# Patient Record
Sex: Male | Born: 1935 | Hispanic: No | Marital: Married | Smoking: Former smoker
Health system: Southern US, Community
[De-identification: ages and names within clinical notes are randomized; demographics above are authoritative.]

## PROBLEM LIST (undated history)

## (undated) DIAGNOSIS — E119 Type 2 diabetes mellitus without complications: Secondary | ICD-10-CM

## (undated) DIAGNOSIS — I10 Essential (primary) hypertension: Secondary | ICD-10-CM

## (undated) DIAGNOSIS — J939 Pneumothorax, unspecified: Secondary | ICD-10-CM

## (undated) DIAGNOSIS — I509 Heart failure, unspecified: Secondary | ICD-10-CM

## (undated) DIAGNOSIS — I219 Acute myocardial infarction, unspecified: Secondary | ICD-10-CM

## (undated) DIAGNOSIS — C801 Malignant (primary) neoplasm, unspecified: Secondary | ICD-10-CM

## (undated) HISTORY — PX: CORONARY ARTERY BYPASS GRAFT: SHX141

## (undated) HISTORY — PX: CORONARY STENT PLACEMENT: SHX1402

---

## 1992-09-01 DIAGNOSIS — I2510 Atherosclerotic heart disease of native coronary artery without angina pectoris: Secondary | ICD-10-CM

## 1992-09-01 HISTORY — DX: Atherosclerotic heart disease of native coronary artery without angina pectoris: I25.10

## 2017-01-01 DIAGNOSIS — I340 Nonrheumatic mitral (valve) insufficiency: Secondary | ICD-10-CM | POA: Insufficient documentation

## 2017-01-01 DIAGNOSIS — I480 Paroxysmal atrial fibrillation: Secondary | ICD-10-CM | POA: Diagnosis present

## 2017-01-01 DIAGNOSIS — I351 Nonrheumatic aortic (valve) insufficiency: Secondary | ICD-10-CM | POA: Insufficient documentation

## 2017-01-01 DIAGNOSIS — E7800 Pure hypercholesterolemia, unspecified: Secondary | ICD-10-CM | POA: Insufficient documentation

## 2017-01-01 DIAGNOSIS — K56609 Unspecified intestinal obstruction, unspecified as to partial versus complete obstruction: Secondary | ICD-10-CM | POA: Insufficient documentation

## 2017-01-01 DIAGNOSIS — J449 Chronic obstructive pulmonary disease, unspecified: Secondary | ICD-10-CM | POA: Diagnosis present

## 2017-01-01 DIAGNOSIS — E271 Primary adrenocortical insufficiency: Secondary | ICD-10-CM | POA: Insufficient documentation

## 2017-01-01 DIAGNOSIS — I10 Essential (primary) hypertension: Secondary | ICD-10-CM | POA: Insufficient documentation

## 2017-01-05 DIAGNOSIS — Z951 Presence of aortocoronary bypass graft: Secondary | ICD-10-CM

## 2018-07-26 DIAGNOSIS — J95811 Postprocedural pneumothorax: Secondary | ICD-10-CM | POA: Insufficient documentation

## 2020-02-03 DIAGNOSIS — N179 Acute kidney failure, unspecified: Secondary | ICD-10-CM | POA: Insufficient documentation

## 2020-02-03 DIAGNOSIS — E871 Hypo-osmolality and hyponatremia: Secondary | ICD-10-CM | POA: Insufficient documentation

## 2020-03-21 ENCOUNTER — Encounter (HOSPITAL_BASED_OUTPATIENT_CLINIC_OR_DEPARTMENT_OTHER): Payer: Self-pay

## 2020-03-21 ENCOUNTER — Emergency Department (HOSPITAL_BASED_OUTPATIENT_CLINIC_OR_DEPARTMENT_OTHER): Payer: Medicare Other

## 2020-03-21 ENCOUNTER — Other Ambulatory Visit: Payer: Self-pay

## 2020-03-21 ENCOUNTER — Inpatient Hospital Stay (HOSPITAL_BASED_OUTPATIENT_CLINIC_OR_DEPARTMENT_OTHER)
Admission: EM | Admit: 2020-03-21 | Discharge: 2020-03-23 | DRG: 291 | Disposition: A | Discharge status: Home / Self Care | Payer: Medicare Other | Attending: Internal Medicine | Admitting: Internal Medicine

## 2020-03-21 DIAGNOSIS — J9621 Acute and chronic respiratory failure with hypoxia: Secondary | ICD-10-CM | POA: Diagnosis present

## 2020-03-21 DIAGNOSIS — I110 Hypertensive heart disease with heart failure: Secondary | ICD-10-CM | POA: Diagnosis present

## 2020-03-21 DIAGNOSIS — Z923 Personal history of irradiation: Secondary | ICD-10-CM

## 2020-03-21 DIAGNOSIS — Z79899 Other long term (current) drug therapy: Secondary | ICD-10-CM | POA: Diagnosis not present

## 2020-03-21 DIAGNOSIS — R7989 Other specified abnormal findings of blood chemistry: Secondary | ICD-10-CM

## 2020-03-21 DIAGNOSIS — I252 Old myocardial infarction: Secondary | ICD-10-CM

## 2020-03-21 DIAGNOSIS — Z955 Presence of coronary angioplasty implant and graft: Secondary | ICD-10-CM

## 2020-03-21 DIAGNOSIS — J9601 Acute respiratory failure with hypoxia: Secondary | ICD-10-CM | POA: Diagnosis present

## 2020-03-21 DIAGNOSIS — Z87891 Personal history of nicotine dependence: Secondary | ICD-10-CM | POA: Diagnosis not present

## 2020-03-21 DIAGNOSIS — J449 Chronic obstructive pulmonary disease, unspecified: Secondary | ICD-10-CM | POA: Diagnosis present

## 2020-03-21 DIAGNOSIS — Z7952 Long term (current) use of systemic steroids: Secondary | ICD-10-CM

## 2020-03-21 DIAGNOSIS — E785 Hyperlipidemia, unspecified: Secondary | ICD-10-CM | POA: Diagnosis present

## 2020-03-21 DIAGNOSIS — Z20822 Contact with and (suspected) exposure to covid-19: Secondary | ICD-10-CM | POA: Diagnosis present

## 2020-03-21 DIAGNOSIS — I480 Paroxysmal atrial fibrillation: Secondary | ICD-10-CM | POA: Diagnosis present

## 2020-03-21 DIAGNOSIS — G2581 Restless legs syndrome: Secondary | ICD-10-CM | POA: Diagnosis present

## 2020-03-21 DIAGNOSIS — Z951 Presence of aortocoronary bypass graft: Secondary | ICD-10-CM | POA: Diagnosis not present

## 2020-03-21 DIAGNOSIS — Z85118 Personal history of other malignant neoplasm of bronchus and lung: Secondary | ICD-10-CM | POA: Diagnosis not present

## 2020-03-21 DIAGNOSIS — D649 Anemia, unspecified: Secondary | ICD-10-CM | POA: Diagnosis present

## 2020-03-21 DIAGNOSIS — Z7901 Long term (current) use of anticoagulants: Secondary | ICD-10-CM | POA: Diagnosis not present

## 2020-03-21 DIAGNOSIS — I509 Heart failure, unspecified: Secondary | ICD-10-CM | POA: Diagnosis not present

## 2020-03-21 DIAGNOSIS — Z7902 Long term (current) use of antithrombotics/antiplatelets: Secondary | ICD-10-CM | POA: Diagnosis not present

## 2020-03-21 DIAGNOSIS — I2510 Atherosclerotic heart disease of native coronary artery without angina pectoris: Secondary | ICD-10-CM | POA: Diagnosis present

## 2020-03-21 DIAGNOSIS — I5023 Acute on chronic systolic (congestive) heart failure: Secondary | ICD-10-CM | POA: Diagnosis present

## 2020-03-21 DIAGNOSIS — Z9114 Patient's other noncompliance with medication regimen: Secondary | ICD-10-CM | POA: Diagnosis not present

## 2020-03-21 DIAGNOSIS — E119 Type 2 diabetes mellitus without complications: Secondary | ICD-10-CM | POA: Diagnosis present

## 2020-03-21 DIAGNOSIS — E271 Primary adrenocortical insufficiency: Secondary | ICD-10-CM | POA: Diagnosis present

## 2020-03-21 HISTORY — DX: Type 2 diabetes mellitus without complications: E11.9

## 2020-03-21 HISTORY — DX: Heart failure, unspecified: I50.9

## 2020-03-21 HISTORY — DX: Essential (primary) hypertension: I10

## 2020-03-21 HISTORY — DX: Pneumothorax, unspecified: J93.9

## 2020-03-21 HISTORY — DX: Acute myocardial infarction, unspecified: I21.9

## 2020-03-21 HISTORY — DX: Malignant (primary) neoplasm, unspecified: C80.1

## 2020-03-21 LAB — COMPREHENSIVE METABOLIC PANEL
ALT: 10 U/L (ref 0–44)
AST: 15 U/L (ref 15–41)
Albumin: 3.7 g/dL (ref 3.5–5.0)
Alkaline Phosphatase: 59 U/L (ref 38–126)
Anion gap: 9 (ref 5–15)
BUN: 14 mg/dL (ref 8–23)
CO2: 23 mmol/L (ref 22–32)
Calcium: 9.5 mg/dL (ref 8.9–10.3)
Chloride: 104 mmol/L (ref 98–111)
Creatinine, Ser: 0.82 mg/dL (ref 0.61–1.24)
GFR calc Af Amer: >60 mL/min (ref >60)
GFR calc non Af Amer: >60 mL/min (ref >60)
Glucose, Bld: 148 mg/dL — ABNORMAL HIGH (ref 70–99)
Potassium: 4.5 mmol/L (ref 3.5–5.1)
Sodium: 136 mmol/L (ref 135–145)
Total Bilirubin: 0.5 mg/dL (ref 0.3–1.2)
Total Protein: 7.1 g/dL (ref 6.5–8.1)

## 2020-03-21 LAB — LACTIC ACID, PLASMA
Lactic Acid, Venous: 3.3 mmol/L (ref 0.5–1.9)
Lactic Acid, Venous: 1.3 mmol/L (ref 0.5–1.9)

## 2020-03-21 LAB — URINALYSIS, ROUTINE W REFLEX MICROSCOPIC
Bilirubin Urine: NEGATIVE
Glucose, UA: NEGATIVE mg/dL
Hgb urine dipstick: NEGATIVE
Ketones, ur: NEGATIVE mg/dL
Leukocytes,Ua: NEGATIVE
Nitrite: NEGATIVE
Protein, ur: NEGATIVE mg/dL
Specific Gravity, Urine: 1.01 (ref 1.005–1.030)
pH: 5.5 (ref 5.0–8.0)

## 2020-03-21 LAB — CBC WITH DIFFERENTIAL/PLATELET
Abs Immature Granulocytes: 0.06 10*3/uL (ref 0.00–0.07)
Basophils Absolute: 0.1 10*3/uL (ref 0.0–0.1)
Basophils Relative: 0 %
Eosinophils Absolute: 0.2 10*3/uL (ref 0.0–0.5)
Eosinophils Relative: 1 %
HCT: 31 % — ABNORMAL LOW (ref 39.0–52.0)
Hemoglobin: 9.6 g/dL — ABNORMAL LOW (ref 13.0–17.0)
Immature Granulocytes: 1 %
Lymphocytes Relative: 26 %
Lymphs Abs: 3.1 10*3/uL (ref 0.7–4.0)
MCH: 23.6 pg — ABNORMAL LOW (ref 26.0–34.0)
MCHC: 31 g/dL (ref 30.0–36.0)
MCV: 76.4 fL — ABNORMAL LOW (ref 80.0–100.0)
Monocytes Absolute: 1.2 10*3/uL — ABNORMAL HIGH (ref 0.1–1.0)
Monocytes Relative: 10 %
Neutro Abs: 7.5 10*3/uL (ref 1.7–7.7)
Neutrophils Relative %: 62 %
Platelets: 349 10*3/uL (ref 150–400)
RBC: 4.06 MIL/uL — ABNORMAL LOW (ref 4.22–5.81)
RDW: 18.7 % — ABNORMAL HIGH (ref 11.5–15.5)
WBC: 12.1 10*3/uL — ABNORMAL HIGH (ref 4.0–10.5)
nRBC: 0 % (ref 0.0–0.2)

## 2020-03-21 LAB — TROPONIN I (HIGH SENSITIVITY)
Troponin I (High Sensitivity): 28 ng/L — ABNORMAL HIGH (ref <18)
Troponin I (High Sensitivity): 29 ng/L — ABNORMAL HIGH (ref <18)

## 2020-03-21 LAB — BRAIN NATRIURETIC PEPTIDE: B Natriuretic Peptide: 1231.1 pg/mL — ABNORMAL HIGH (ref 0.0–100.0)

## 2020-03-21 LAB — OCCULT BLOOD X 1 CARD TO LAB, STOOL: Fecal Occult Bld: POSITIVE — AB

## 2020-03-21 LAB — SARS CORONAVIRUS 2 BY RT PCR (HOSPITAL ORDER, PERFORMED IN ~~LOC~~ HOSPITAL LAB): SARS Coronavirus 2: NEGATIVE

## 2020-03-21 MED ORDER — ALBUTEROL SULFATE (2.5 MG/3ML) 0.083% IN NEBU
INHALATION_SOLUTION | RESPIRATORY_TRACT | Status: AC
  Administered 2020-03-21: 2.5 mg
  Filled 2020-03-21: qty 3

## 2020-03-21 MED ORDER — FUROSEMIDE 10 MG/ML IJ SOLN
60.0000 mg | Freq: Once | INTRAMUSCULAR | Status: AC
  Administered 2020-03-21: 60 mg via INTRAVENOUS
  Filled 2020-03-21: qty 6

## 2020-03-21 MED ORDER — LORAZEPAM 2 MG/ML IJ SOLN
INTRAMUSCULAR | Status: AC
  Filled 2020-03-21: qty 1

## 2020-03-21 MED ORDER — LORAZEPAM 2 MG/ML IJ SOLN
0.5000 mg | Freq: Once | INTRAMUSCULAR | Status: AC
  Administered 2020-03-21: 0.5 mg via INTRAVENOUS

## 2020-03-21 MED ORDER — PANTOPRAZOLE SODIUM 40 MG IV SOLR
40.0000 mg | Freq: Once | INTRAVENOUS | Status: AC
  Administered 2020-03-21: 40 mg via INTRAVENOUS
  Filled 2020-03-21: qty 40

## 2020-03-21 MED ORDER — ALBUTEROL SULFATE (2.5 MG/3ML) 0.083% IN NEBU
2.5000 mg | INHALATION_SOLUTION | Freq: Once | RESPIRATORY_TRACT | Status: AC
  Administered 2020-03-21: 2.5 mg via RESPIRATORY_TRACT
  Filled 2020-03-21: qty 3

## 2020-03-21 MED ORDER — ACETAMINOPHEN 500 MG PO TABS
1000.0000 mg | ORAL_TABLET | Freq: Once | ORAL | Status: AC
  Administered 2020-03-21: 1000 mg via ORAL
  Filled 2020-03-21: qty 2

## 2020-03-21 MED ORDER — SODIUM CHLORIDE 0.9 % IV SOLN
500.0000 mg | INTRAVENOUS | Status: DC
  Administered 2020-03-21: 500 mg via INTRAVENOUS
  Filled 2020-03-21 (×2): qty 500

## 2020-03-21 MED ORDER — SODIUM CHLORIDE 0.9 % IV SOLN: INTRAVENOUS | Status: DC | PRN

## 2020-03-21 MED ORDER — SODIUM CHLORIDE 0.9 % IV SOLN
2.0000 g | INTRAVENOUS | Status: DC
  Administered 2020-03-21: 2 g via INTRAVENOUS
  Filled 2020-03-21 (×2): qty 20

## 2020-03-21 NOTE — ED Provider Notes (Addendum)
[facility] EMERGENCY DEPARTMENT Provider Note   CSN: [id number] Arrival date & time: 03/21/20  2878     History Chief Complaint  Patient presents with  . Shortness of Breath   Granddaughter translating, they declined formal interpreted.   Cody Charles is a 86 y.o. male with history of CAD s/p CABG, AMI May 2021, lung cancer s/p radiation now on surveillance, COPD on at home oxygen 2-3 L [place] PRN and at night, grade 1 diastolic dysfunction EF 67-67%, PAF on Eliquis, HTN, HLD presents to ER for evaluation of several concerns including worsening shortness of breath associated with chest and abdominal "burning", black diarrhea and dry mouth.  Reports long history of chronic exertional shortness of breath and orthopnea associated with chest "burning" that gradually for years, seemed to worsen after lung cancer diagnosis and recent heart attack may 2021, acutely worsened in last 3-4 days.  Feels like he can't take deep breaths. Taking fast short breaths.  Shortness of breath worse with activity, climbing up stairs.  Better if he sits forward, rests.  No relief with albuterol inhalers.  States usually he uses home oxygen 2-3 hours a day and at night only.  Has had to use 3 L [place] oxygen at home 24/7 last 3-4 days.  Shortness of breath accompanied by burning in his chest that admits has been going on for years as well.  No chest discomfort or burning currently. Chronic fatigue. Chronic productive blood tinged cough for years that actually seems a little bit better with less sputum and less blood.  Pink tinged. No fever.  Noticed black watery diarrhea 2 days ago, having 2-3 episodes a day.  Some burning in stomach around umbilicus and lower left side.  No nausea, vomiting.  No congestion, sore throat, Uri symptoms. Vaccinated for [place].  Compliant with all meds including Eliquis and fluid pills. No changes in urine output.  No longer smoking.  He drove down to visit granddaughter from [place] 3-4 days ago.    HPI     Past Medical History:  Diagnosis Date  . AMI (acute myocardial infarction) ([place])   . Cancer ([place])    lung   . CHF (congestive heart failure) ([place])   . Coronary artery disease   . Diabetes mellitus without complication ([place])   . Pneumothorax on right     Patient Active Problem List   Diagnosis Date Noted  . Acute on chronic respiratory failure with hypoxia ([place]) 03/21/2020  . AKI (acute kidney injury) ([place]) 02/03/2020  . Hypo-osmolality and hyponatremia 02/03/2020  . Pneumothorax, post biopsy, right 07/26/2018  . S/P CABG (coronary artery bypass graft) 01/05/2017  . Addison disease ([place]) 01/01/2017  . COPD (chronic obstructive pulmonary disease) ([place]) 01/01/2017  . Hypercholesterolemia 01/01/2017  . Hypertension 01/01/2017  . Mild AI (aortic insufficiency) 01/01/2017  . Mild mitral regurgitation 01/01/2017  . PAF (paroxysmal atrial fibrillation) ([place]) 01/01/2017  . SBO (small bowel obstruction) ([place]) 01/01/2017    Past Surgical History:  Procedure Laterality Date  . CORONARY ARTERY BYPASS GRAFT    . CORONARY STENT PLACEMENT         No family history on file.  Social History   Tobacco Use  . Smoking status: Former Research scientist (life sciences)  . Smokeless tobacco: Never Used  Vaping Use  . Vaping Use: Never used  Substance Use Topics  . Alcohol use: Not Currently  . Drug use: Never    Home Medications Prior to Admission medications   Medication Sig Start Date End  Date Taking? Authorizing Provider  diltiazem (CARDIZEM) 100 MG injection Inject 120 mg into the vein daily.   Yes [provider]  enalapril (VASOTEC) 10 MG tablet Take 10 mg by mouth daily.   Yes [provider]  albuterol (ACCUNEB) 0.63 MG/3ML nebulizer solution Inhale into the lungs.    [provider]  apixaban (ELIQUIS) 5 MG TABS tablet Take by mouth.    [provider]  atorvastatin (LIPITOR) 40 MG tablet Take by mouth.    [provider]  carvedilol (COREG)  6.25 MG tablet Take by mouth.    [provider]  Cholecalciferol 25 MCG (1000 UT) tablet Take by mouth.    [provider]  clopidogrel (PLAVIX) 75 MG tablet Take by mouth.    [provider]  Fluticasone-Umeclidin-Vilant (TRELEGY ELLIPTA) 100-62.5-25 MCG/INH AEPB Inhale into the lungs.    [provider]  pantoprazole (PROTONIX) 40 MG tablet Take by mouth.    [provider]  pravastatin (PRAVACHOL) 10 MG tablet Take by mouth.    [provider]  predniSONE (DELTASONE) 5 MG tablet Take by mouth.    [provider]  roflumilast (DALIRESP) 500 MCG TABS tablet Take by mouth.    [provider]  rOPINIRole (REQUIP) 2 MG tablet Take by mouth.    [provider]    Allergies    Patient has no known allergies.  Review of Systems   Review of Systems  Constitutional: Positive for fatigue.  Respiratory: Positive for cough and shortness of breath.   Cardiovascular: Positive for chest pain.  Gastrointestinal: Positive for abdominal pain (burning), constipation and diarrhea.  Hematological: Bruises/bleeds easily.  All other systems reviewed and are negative.   Physical Exam Updated Vital Signs BP 113/84   Pulse 91   Temp 98.4 F (36.9 C) (Oral)   Resp (!) 24   Wt 70.2 kg   SpO2 100%   Physical Exam Vitals and nursing note reviewed.  Constitutional:      General: He is not in acute distress.    Appearance: He is well-developed.     Comments: NAD.  HENT:     Head: Normocephalic and atraumatic.     Right Ear: External ear normal.     Left Ear: External ear normal.     Nose: Nose normal.     Mouth/Throat:     Comments: Dry lips and MM Eyes:     General: No scleral icterus.    Conjunctiva/sclera: Conjunctivae normal.  Cardiovascular:     Rate and Rhythm: Normal rate and regular rhythm.     Heart sounds: Normal heart sounds. No murmur heard.      Comments: No LE edema. No calf tenderness. Pulmonary:      Effort: Pulmonary effort is normal. Tachypnea present.     Breath sounds: Examination of the right-upper field reveals decreased breath sounds. Examination of the right-middle field reveals decreased breath sounds. Decreased breath sounds present. No wheezing.     Comments: Diminished air sounds in right lung. Tachypnic, noted to take frequent short breaths. Improves with 3 L [place].  No crackles or rhonchi.  Abdominal:     Palpations: Abdomen is soft.     Tenderness: There is no abdominal tenderness.     Comments: Ventral hernia, soft non tender, easily reducible.  No abdominal tenderness. No obvious distention. No CVA or suprapubic tenderness.  Musculoskeletal:        General: No deformity. Normal range of motion.  Cervical back: Normal range of motion and neck supple.  Skin:    General: Skin is warm and dry.     Capillary Refill: Capillary refill takes less than 2 seconds.  Neurological:     Mental Status: He is alert and oriented to person, place, and time.  Psychiatric:        Behavior: Behavior normal.        Thought Content: Thought content normal.        Judgment: Judgment normal.     ED Results / Procedures / Treatments   Labs (all labs ordered are listed, but only abnormal results are displayed) Labs Reviewed  COMPREHENSIVE METABOLIC PANEL - Abnormal; Notable for the following components:      Result Value   Glucose, Bld 148 (*)    All other components within normal limits  CBC WITH DIFFERENTIAL/PLATELET - Abnormal; Notable for the following components:   WBC 12.1 (*)    RBC 4.06 (*)    Hemoglobin 9.6 (*)    HCT 31.0 (*)    MCV 76.4 (*)    MCH 23.6 (*)    RDW 18.7 (*)    Monocytes Absolute 1.2 (*)    All other components within normal limits  BRAIN NATRIURETIC PEPTIDE - Abnormal; Notable for the following components:   B Natriuretic Peptide 1,231.1 (*)    All other components within normal limits  LACTIC ACID, PLASMA - Abnormal; Notable for the following  components:   Lactic Acid, Venous 3.3 (*)    All other components within normal limits  OCCULT BLOOD X 1 CARD TO LAB, STOOL - Abnormal; Notable for the following components:   Fecal Occult Bld POSITIVE (*)    All other components within normal limits  TROPONIN I (HIGH SENSITIVITY) - Abnormal; Notable for the following components:   Troponin I (High Sensitivity) 28 (*)    All other components within normal limits  TROPONIN I (HIGH SENSITIVITY) - Abnormal; Notable for the following components:   Troponin I (High Sensitivity) 29 (*)    All other components within normal limits  SARS CORONAVIRUS 2 BY RT PCR (HOSPITAL ORDER, [facility])  CULTURE, BLOOD (ROUTINE X 2)  CULTURE, BLOOD (ROUTINE X 2)  URINALYSIS, ROUTINE W REFLEX MICROSCOPIC  LACTIC ACID, PLASMA  POC OCCULT BLOOD, ED    EKG EKG Interpretation  Date/Time:  Wednesday March 21 2020 09:19:36 EDT Ventricular Rate:  96 PR Interval:    QRS Duration: 93 QT Interval:  344 QTC Calculation: 435 R Axis:   0 Text Interpretation: Sinus rhythm `st depression I, II, V2-V5 Baseline wander No previous tracing Confirmed by Lajean Saver [phone]) on 03/21/2020 9:26:48 AM   Radiology DG Chest 2 View  Result Date: 03/21/2020 CLINICAL DATA:  Shortness of breath EXAM: CHEST - 2 VIEW COMPARISON:  None. FINDINGS: Right mid lung consolidation. Interstitial prominence. Lucency in the upper lungs may reflect bulla. No pleural effusion. No pneumothorax. Normal heart size. No acute osseous abnormality. IMPRESSION: Right mid lung consolidation suspicious for pneumonia. Follow-up is recommended to ensure resolution. Age-indeterminate interstitial prominence which may be chronic, reflect edema, or changes associated with pneumonia. Electronically Signed   By: Macy Mis M.D.   On: 03/21/2020 10:41    Procedures .Critical Care Performed by: Kinnie Feil, PA-C Authorized by: Kinnie Feil, PA-C   Critical care  provider statement:    Critical care time (minutes):  45   Critical care was necessary to treat or prevent imminent  or life-threatening deterioration of the following conditions:  Cardiac failure   Critical care was time spent personally by me on the following activities:  Discussions with consultants, evaluation of patient's response to treatment, examination of patient, ordering and performing treatments and interventions, ordering and review of laboratory studies, ordering and review of radiographic studies, pulse oximetry, re-evaluation of patient's condition, obtaining history from patient or surrogate, review of old charts and development of treatment plan with patient or surrogate   I assumed direction of critical care for this patient from another provider in my specialty: no     (including critical care time)  Medications Ordered in ED Medications  cefTRIAXone (ROCEPHIN) 2 g in sodium chloride 0.9 % 100 mL IVPB ( Intravenous Stopped 03/21/20 1208)  azithromycin (ZITHROMAX) 500 mg in sodium chloride 0.9 % 250 mL IVPB ( Intravenous Stopped 03/21/20 1322)  0.9 %  sodium chloride infusion ( Intravenous New Bag/Given 03/21/20 1123)  albuterol (PROVENTIL) (2.5 MG/3ML) 0.083% nebulizer solution 2.5 mg (has no administration in time range)  furosemide (LASIX) injection 60 mg (60 mg Intravenous Given 03/21/20 1135)  pantoprazole (PROTONIX) injection 40 mg (40 mg Intravenous Given 03/21/20 1254)    ED Course  I have reviewed the triage vital signs and the nursing notes.  Pertinent labs & imaging results that were available during my care of the patient were reviewed by me and considered in my medical decision making (see chart for details).  Clinical Course as of Mar 21 1412  Wed Mar 21, 2020  1116 On chronic prednisone   WBC(!): 12.1 [CG]  1123 B Natriuretic Peptide(!): 1,231.1 [CG]  1123 Troponin I (High Sensitivity)(!): 28 [CG]  1123 Right mid lung consolidation suspicious for pneumonia.  Follow-up is recommended to ensure resolution. Age-indeterminate interstitial prominence which may be chronic, reflect edema, or changes associated with pneumonia.  DG Chest 2 View [CG]  1234 Fecal Occult Blood, POC(!): POSITIVE [CG]  1234 Lactic Acid, Venous(!!): 3.3 [CG]  1234 Troponin I (High Sensitivity)(!): 29 [CG]  1234 SARS Coronavirus 2: NEGATIVE [CG]  1235 Sinus rhythm `st depression I, II, V2-V5 Baseline wander No previous tracing Confirmed by Lajean Saver [phone]) on 03/21/2020 9:26:48 AM  EKG 12-Lead [CG]  1235 No active CP  EKG 12-Lead [CG]    Clinical Course User Index [CG] Kinnie Feil, PA-C   MDM Rules/Calculators/A&P                          I obtained additional history from triage, nursing notes and review of medical chart.  Previous medical records available, nursing notes reviewed to obtain more history and assist with MDM  I reviewed hospitalization in may 2021 [facility].  Admitted for STEMI with emergent CABG/LHC.  BNP 1120 and was diuresed discharged on diuretics.  Noted to have stable small right apical PTX, AKI.  Imaging revealed possible RUL infiltrates but afebrile, no leukocytosis and procalcitonin negative.  Mid lung density noted.  Strep pneumo and sputum culture negative, ID consulted recommended dc antibiotics and monitor.    This patient complains of acute on chronic SOB exertional with orthopnea, chest burning, diarrhea that is "black" like coffee.  On eliquis. Claims he has not missed any medicines, granddaughter unsure.    Chief complain involves an extensive number of treatment options and is a complaint that carries with it a high risk of complications and morbidity and mortality.    The differential diagnosis includes GIB/symptomatic anemia, CHF exacerbation, health  care associated PNA, ACS/NSTEMI.  ?PE, COVID unlikely.  COPD exacebration.  Likely multifactorial from CHF, COPD, lung cancer.    I ordered laboratory studies including  including screening labs, trop, BNP, COVID.   I ordered imaging studies including CXR, EKG.   1240: ER work up personally visualized and interpreted as above.   Stable hemoglobin. Sample for hemoccult from DRE exam but also from stool in commode that was mixed with urine. Stool was brown, formed. No abdominal tenderness.  Will send UA.   BNP 1231. Trop 29. EKG with STD I, II and V4-6.  No active CP.  ?Stress/demand ischemia.  Will repeat trops.    Lactic acid 3.3. But patient afebrile, without tachycardia.  Reports improvement in his chronic cough/sputum.  Mild leukocytosis but on chronic prednisone. CXR today shows possible PNA but this ?infiltrate has been noted in previous CXR at OSH.  Lower suspicion for infectious process.  EDP ordered antibiotics, blood cultures, repeat lactic acid.  Will hold off on IVF since no convincing symptoms of infectious, hypotension.  Lactic acid may be from dehydration, other. AG normal.   I ordered medications protonix, albuterol nebulizer, lasix.  Strict IO.   Spoke to Dr Lorin Mercy who has admitted patient to progressive bed.    Re-evaluated patient who is hesitant to be admitted but agreeable.  Will message GI on call for hemoglobin 9.6 with positive brown hemoccult for evaluation as admitted. Patient is HD stable without active or severe GI hemorrhage.   Final Clinical Impression(s) / ED Diagnoses Final diagnoses:  Elevated brain natriuretic peptide (BNP) level    Rx / DC Orders ED Discharge Orders    None         Arlean Hopping 03/21/20 1413    Lajean Saver, MD 03/22/20 [phone]

## 2020-03-21 NOTE — ED Notes (Signed)
Patients Granddaughter Contact Information  Ayesha Mohair (granddaughter) [phone]

## 2020-03-21 NOTE — ED Notes (Signed)
Engineer, technical sales at bedside, paperwork provided, bedside report reinforced

## 2020-03-21 NOTE — ED Notes (Signed)
BC X 2 OBTAINED PRIOR TO ABX ADMINISTERED AS PER EDP ORDERS

## 2020-03-21 NOTE — ED Notes (Signed)
Pt on monitor 

## 2020-03-21 NOTE — ED Notes (Signed)
Cont to await for room assignment, family at bedside, charge nurse has approved that outside food be brought in due to his cultural diet since awaiting admission to [facility].

## 2020-03-21 NOTE — ED Notes (Signed)
Attempted to contact granddaughter , Cody Charles, no answer at this time.

## 2020-03-21 NOTE — ED Notes (Signed)
Patient rang the bell and I went to the room to check patient.  Patient's daughter at bedside.  Patient stated that he has difficulty breathing.  Informed patient and daughter that his O2 sat is 100% on 02 [place].  Encouraged him to stay calm and relaxed.  He also wanted to know about his transfer to another hospital.  Updated them that at this time, we are waiting for a room assignment and once we have the Room I will make sure that they will be informed.  Both of them verbalized understanding.

## 2020-03-21 NOTE — ED Notes (Signed)
PO fluids and snacks provided

## 2020-03-21 NOTE — ED Triage Notes (Signed)
Pt presents with ShOB and a "burning sensation" across his chest. Orthopnea present. Denies peripheral edema. Pt has a hx of AMI at the end of May. Pt has home O2 2L prn, but O2 has not helped ShOB.

## 2020-03-21 NOTE — ED Notes (Signed)
RT note-Albuterol treatment given, HR 87, o2 remains at 2l/min [place], sp02 97%

## 2020-03-21 NOTE — ED Notes (Signed)
Medicated per EDP orders, safety measures in place, cont on cardiac monitor with cont POX monitoring and int NBP assessments. Pt instructed not to get up off stretcher w/o staff in room, side rails x 2 remain up and bed in lowest position, callbell next to pt.

## 2020-03-21 NOTE — Progress Notes (Addendum)
[facility] to [facility] transfer:  Patient with h/o DM; CAD s/p CABG; chronic diastolic CHF; COPD on 7-6E home O2; afib on Eliquis; HTN; HLD; and lung cancer s/p radiation and now on surveillance presenting to [facility] with CP/SOB.  Patient is from [place], drove to [place] to visit granddaughter 4 days ago.  Upon arrival, acute on chronic SOB, exertional.  +orthopnea and chest burning.  Symptoms are chronic but worse.  Uses O2 qhs and prn but has been using 24/7.  Appears to be CHF - BNP 1200, troponin 28.  EKG with T wave flattening in lateral leads and I, II.  No active CP, discomfort there is more chronic in nature.  CXR from May with ?PNA (also small PTX, needed stents then) - chronic, edema, PNA are all possible.  No fever. Lactate pending.  Maybe early PNA, but also COPD, lung CA.  Given antibiotics, Lasix.  Likely needs diuresis, serial troponin.  COVID vaccinated.  Also with black diarrhea, brown stool on guaiac but heme positive, Hgb stable.  Likely needs admission to progressive care unit, will place order.   Carlyon Shadow, M.D

## 2020-03-22 ENCOUNTER — Encounter (HOSPITAL_COMMUNITY): Payer: Self-pay | Admitting: Internal Medicine

## 2020-03-22 DIAGNOSIS — D649 Anemia, unspecified: Secondary | ICD-10-CM | POA: Diagnosis present

## 2020-03-22 DIAGNOSIS — J9621 Acute and chronic respiratory failure with hypoxia: Secondary | ICD-10-CM

## 2020-03-22 DIAGNOSIS — I509 Heart failure, unspecified: Secondary | ICD-10-CM

## 2020-03-22 DIAGNOSIS — J9601 Acute respiratory failure with hypoxia: Secondary | ICD-10-CM | POA: Diagnosis present

## 2020-03-22 DIAGNOSIS — J449 Chronic obstructive pulmonary disease, unspecified: Secondary | ICD-10-CM

## 2020-03-22 LAB — CBC
HCT: 34.1 % — ABNORMAL LOW (ref 39.0–52.0)
HCT: 33.4 % — ABNORMAL LOW (ref 39.0–52.0)
HCT: 34.9 % — ABNORMAL LOW (ref 39.0–52.0)
Hemoglobin: 10.8 g/dL — ABNORMAL LOW (ref 13.0–17.0)
Hemoglobin: 10.6 g/dL — ABNORMAL LOW (ref 13.0–17.0)
Hemoglobin: 11 g/dL — ABNORMAL LOW (ref 13.0–17.0)
MCH: 23.6 pg — ABNORMAL LOW (ref 26.0–34.0)
MCH: 24 pg — ABNORMAL LOW (ref 26.0–34.0)
MCH: 23.8 pg — ABNORMAL LOW (ref 26.0–34.0)
MCHC: 31.7 g/dL (ref 30.0–36.0)
MCHC: 31.7 g/dL (ref 30.0–36.0)
MCHC: 31.5 g/dL (ref 30.0–36.0)
MCV: 74.6 fL — ABNORMAL LOW (ref 80.0–100.0)
MCV: 75.6 fL — ABNORMAL LOW (ref 80.0–100.0)
MCV: 75.4 fL — ABNORMAL LOW (ref 80.0–100.0)
Platelets: 403 10*3/uL — ABNORMAL HIGH (ref 150–400)
Platelets: 383 10*3/uL (ref 150–400)
Platelets: 393 10*3/uL (ref 150–400)
RBC: 4.57 MIL/uL (ref 4.22–5.81)
RBC: 4.42 MIL/uL (ref 4.22–5.81)
RBC: 4.63 MIL/uL (ref 4.22–5.81)
RDW: 19.1 % — ABNORMAL HIGH (ref 11.5–15.5)
RDW: 18.8 % — ABNORMAL HIGH (ref 11.5–15.5)
RDW: 18.8 % — ABNORMAL HIGH (ref 11.5–15.5)
WBC: 10.1 10*3/uL (ref 4.0–10.5)
WBC: 10.2 10*3/uL (ref 4.0–10.5)
WBC: 11 10*3/uL — ABNORMAL HIGH (ref 4.0–10.5)
nRBC: 0 % (ref 0.0–0.2)
nRBC: 0 % (ref 0.0–0.2)
nRBC: 0 % (ref 0.0–0.2)

## 2020-03-22 LAB — BASIC METABOLIC PANEL
Anion gap: 14 (ref 5–15)
BUN: 25 mg/dL — ABNORMAL HIGH (ref 8–23)
CO2: 18 mmol/L — ABNORMAL LOW (ref 22–32)
Calcium: 8.2 mg/dL — ABNORMAL LOW (ref 8.9–10.3)
Chloride: 101 mmol/L (ref 98–111)
Creatinine, Ser: 1.24 mg/dL (ref 0.61–1.24)
GFR calc Af Amer: >60 mL/min (ref >60)
GFR calc non Af Amer: 53 mL/min — ABNORMAL LOW (ref >60)
Glucose, Bld: 141 mg/dL — ABNORMAL HIGH (ref 70–99)
Potassium: 4.3 mmol/L (ref 3.5–5.1)
Sodium: 133 mmol/L — ABNORMAL LOW (ref 135–145)

## 2020-03-22 LAB — TYPE AND SCREEN
ABO/RH(D): B POS
Antibody Screen: NEGATIVE

## 2020-03-22 LAB — APTT
aPTT: 78 seconds — ABNORMAL HIGH (ref 24–36)
aPTT: 99 seconds — ABNORMAL HIGH (ref 24–36)

## 2020-03-22 LAB — MAGNESIUM: Magnesium: 2 mg/dL (ref 1.7–2.4)

## 2020-03-22 LAB — TSH: TSH: 0.604 u[IU]/mL (ref 0.350–4.500)

## 2020-03-22 LAB — HEPARIN LEVEL (UNFRACTIONATED): Heparin Unfractionated: 0.87 IU/mL — ABNORMAL HIGH (ref 0.30–0.70)

## 2020-03-22 LAB — ABO/RH: ABO/RH(D): B POS

## 2020-03-22 LAB — TROPONIN I (HIGH SENSITIVITY)
Troponin I (High Sensitivity): 39 ng/L — ABNORMAL HIGH (ref <18)
Troponin I (High Sensitivity): 35 ng/L — ABNORMAL HIGH (ref <18)

## 2020-03-22 LAB — PROCALCITONIN: Procalcitonin: <0.1 ng/mL

## 2020-03-22 MED ORDER — BUDESONIDE 0.25 MG/2ML IN SUSP
0.2500 mg | Freq: Two times a day (BID) | RESPIRATORY_TRACT | Status: DC
  Administered 2020-03-22 – 2020-03-23 (×2): 0.25 mg via RESPIRATORY_TRACT
  Filled 2020-03-22 (×3): qty 2

## 2020-03-22 MED ORDER — ALBUTEROL SULFATE (2.5 MG/3ML) 0.083% IN NEBU: 2.5000 mg | INHALATION_SOLUTION | RESPIRATORY_TRACT | Status: DC | PRN

## 2020-03-22 MED ORDER — PANTOPRAZOLE SODIUM 40 MG PO TBEC
40.0000 mg | DELAYED_RELEASE_TABLET | Freq: Every day | ORAL | Status: DC
  Administered 2020-03-22 – 2020-03-23 (×2): 40 mg via ORAL
  Filled 2020-03-22: qty 2
  Filled 2020-03-22: qty 1

## 2020-03-22 MED ORDER — ONDANSETRON HCL 4 MG PO TABS: 4.0000 mg | ORAL_TABLET | Freq: Four times a day (QID) | ORAL | Status: DC | PRN

## 2020-03-22 MED ORDER — FUROSEMIDE 10 MG/ML IJ SOLN
40.0000 mg | Freq: Two times a day (BID) | INTRAMUSCULAR | Status: DC
  Administered 2020-03-22 – 2020-03-23 (×4): 40 mg via INTRAVENOUS
  Filled 2020-03-22 (×4): qty 4

## 2020-03-22 MED ORDER — PREDNISONE 5 MG PO TABS
5.0000 mg | ORAL_TABLET | Freq: Every day | ORAL | Status: DC
  Administered 2020-03-22 – 2020-03-23 (×2): 5 mg via ORAL
  Filled 2020-03-22 (×3): qty 1

## 2020-03-22 MED ORDER — ACETAMINOPHEN 325 MG PO TABS
650.0000 mg | ORAL_TABLET | Freq: Four times a day (QID) | ORAL | Status: DC | PRN
  Administered 2020-03-22: 650 mg via ORAL
  Filled 2020-03-22: qty 2

## 2020-03-22 MED ORDER — LEVALBUTEROL HCL 0.63 MG/3ML IN NEBU: 0.6300 mg | INHALATION_SOLUTION | Freq: Four times a day (QID) | RESPIRATORY_TRACT | Status: DC | PRN

## 2020-03-22 MED ORDER — LORAZEPAM 2 MG/ML IJ SOLN
0.5000 mg | Freq: Once | INTRAMUSCULAR | Status: AC
  Administered 2020-03-22: 0.5 mg via INTRAVENOUS
  Filled 2020-03-22: qty 1

## 2020-03-22 MED ORDER — IPRATROPIUM BROMIDE 0.02 % IN SOLN: 0.5000 mg | Freq: Four times a day (QID) | RESPIRATORY_TRACT | Status: DC

## 2020-03-22 MED ORDER — ENALAPRIL MALEATE 5 MG PO TABS
10.0000 mg | ORAL_TABLET | Freq: Every day | ORAL | Status: DC
  Administered 2020-03-22: 10 mg via ORAL
  Filled 2020-03-22: qty 2

## 2020-03-22 MED ORDER — CLOPIDOGREL BISULFATE 75 MG PO TABS
75.0000 mg | ORAL_TABLET | Freq: Every day | ORAL | Status: DC
  Administered 2020-03-22 – 2020-03-23 (×3): 75 mg via ORAL
  Filled 2020-03-22 (×3): qty 1

## 2020-03-22 MED ORDER — CARVEDILOL 6.25 MG PO TABS
6.2500 mg | ORAL_TABLET | Freq: Two times a day (BID) | ORAL | Status: DC
  Administered 2020-03-22: 6.25 mg via ORAL
  Filled 2020-03-22: qty 1

## 2020-03-22 MED ORDER — IPRATROPIUM BROMIDE 0.02 % IN SOLN
0.5000 mg | Freq: Two times a day (BID) | RESPIRATORY_TRACT | Status: DC
  Administered 2020-03-22 – 2020-03-23 (×2): 0.5 mg via RESPIRATORY_TRACT
  Filled 2020-03-22 (×3): qty 2.5

## 2020-03-22 MED ORDER — HEPARIN (PORCINE) 25000 UT/250ML-% IV SOLN
1000.0000 [IU]/h | INTRAVENOUS | Status: DC
  Administered 2020-03-22 (×2): 1000 [IU]/h via INTRAVENOUS
  Filled 2020-03-22 (×2): qty 250

## 2020-03-22 MED ORDER — PRAVASTATIN SODIUM 10 MG PO TABS: 10.0000 mg | ORAL_TABLET | Freq: Every day | ORAL | Status: DC

## 2020-03-22 MED ORDER — ALBUTEROL SULFATE (2.5 MG/3ML) 0.083% IN NEBU: 2.5000 mg | INHALATION_SOLUTION | Freq: Two times a day (BID) | RESPIRATORY_TRACT | Status: DC

## 2020-03-22 MED ORDER — DILTIAZEM HCL 30 MG PO TABS
30.0000 mg | ORAL_TABLET | Freq: Four times a day (QID) | ORAL | Status: DC
  Administered 2020-03-22: 30 mg via ORAL
  Filled 2020-03-22 (×2): qty 1

## 2020-03-22 MED ORDER — ROFLUMILAST 500 MCG PO TABS
500.0000 ug | ORAL_TABLET | Freq: Every day | ORAL | Status: DC
  Administered 2020-03-22 – 2020-03-23 (×2): 500 ug via ORAL
  Filled 2020-03-22 (×2): qty 1

## 2020-03-22 MED ORDER — METOPROLOL TARTRATE 12.5 MG HALF TABLET
12.5000 mg | ORAL_TABLET | Freq: Two times a day (BID) | ORAL | Status: DC
  Administered 2020-03-22 – 2020-03-23 (×2): 12.5 mg via ORAL
  Filled 2020-03-22 (×2): qty 1

## 2020-03-22 MED ORDER — ONDANSETRON HCL 4 MG/2ML IJ SOLN: 4.0000 mg | Freq: Four times a day (QID) | INTRAMUSCULAR | Status: DC | PRN

## 2020-03-22 MED ORDER — ROPINIROLE HCL 1 MG PO TABS
2.0000 mg | ORAL_TABLET | Freq: Every day | ORAL | Status: DC
  Administered 2020-03-22 (×2): 2 mg via ORAL
  Filled 2020-03-22 (×2): qty 2

## 2020-03-22 MED ORDER — METOPROLOL TARTRATE 5 MG/5ML IV SOLN
5.0000 mg | Freq: Once | INTRAVENOUS | Status: AC
  Administered 2020-03-22: 5 mg via INTRAVENOUS
  Filled 2020-03-22: qty 5

## 2020-03-22 MED ORDER — ALBUTEROL SULFATE (2.5 MG/3ML) 0.083% IN NEBU: 2.5000 mg | INHALATION_SOLUTION | Freq: Four times a day (QID) | RESPIRATORY_TRACT | Status: DC

## 2020-03-22 MED ORDER — ATORVASTATIN CALCIUM 40 MG PO TABS
40.0000 mg | ORAL_TABLET | Freq: Every day | ORAL | Status: DC
  Administered 2020-03-22: 40 mg via ORAL
  Filled 2020-03-22: qty 1

## 2020-03-22 NOTE — Progress Notes (Signed)
ANTICOAGULATION CONSULT NOTE - Follow-Up Consult  Pharmacy Consult for heparin Indication: atrial fibrillation  No Known Allergies  Patient Measurements: Weight: 70.2 kg (154 lb 12.8 oz)  Vital Signs: Temp: 98.1 F (36.7 C) (07/22 0348) Temp Source: Oral (07/22 0348) BP: 102/75 (07/22 0635) Pulse Rate: 100 (07/22 0635)  Labs: Recent Labs    03/21/20 0943 03/21/20 0943 03/21/20 1129 03/22/20 0052 03/22/20 0252  HGB 9.6*   < >  --  10.8* 10.6*  HCT 31.0*  --   --  34.1* 33.4*  PLT 349  --   --  403* 383  CREATININE 0.82  --   --   --   --   TROPONINIHS 28*   < > 29* 39* 35*   < > = values in this interval not displayed.   Infusions:  . azithromycin Stopped (03/21/20 1322)  . cefTRIAXone (ROCEPHIN)  IV Stopped (03/21/20 1208)  . heparin 1,000 Units/hr (03/22/20 0128)   Assessment: 86yo male admitted for acute on chronic respiratory failure, to transition from Eliquis to UFH for Afib.  Of note FOB+ but stool brown, Hgb stable.  aPTT this morning is therapeutic (aPTT 78, goal of 66-102), Heparin level falsely elevated with recent Apixaban use. CBC stable.   Goal of Therapy:  Heparin level 0.3-0.7 units/ml aPTT 66-102 seconds Monitor platelets by anticoagulation protocol: Yes   Plan:  - Continue Heparin at 1000 units/hr - Will continue to monitor for any signs/symptoms of bleeding and will follow up with aPTT in 8 hours to confirm therapeutic  Thank you for allowing pharmacy to be a part of this patient's care.  Alycia Rossetti, PharmD, BCPS Clinical Pharmacist Clinical phone for 03/22/2020: [phone] 03/22/2020 10:54 AM   **Pharmacist phone directory can now be found on [email or web address] ([email or web address]).  Listed under [place].

## 2020-03-22 NOTE — Progress Notes (Addendum)
ANTICOAGULATION CONSULT NOTE - Initial Consult  Pharmacy Consult for heparin Indication: atrial fibrillation  No Known Allergies  Patient Measurements: Weight: 70.2 kg (154 lb 12.8 oz)  Vital Signs: Temp: 98 F (36.7 C) (07/21 2258) Temp Source: Oral (07/21 2258) BP: 113/91 (07/21 2258) Pulse Rate: 84 (07/21 2258)  Labs: Recent Labs    03/21/20 0943 03/21/20 1129  HGB 9.6*  --   HCT 31.0*  --   PLT 349  --   CREATININE 0.82  --   TROPONINIHS 28* 29*     Medical History: Past Medical History:  Diagnosis Date  . AMI (acute myocardial infarction) ([place])   . Cancer ([place])    lung   . CHF (congestive heart failure) ([place])   . Coronary artery disease   . Diabetes mellitus without complication ([place])   . Pneumothorax on right     Medications:  Medications Prior to Admission  Medication Sig Dispense Refill Last Dose  . diltiazem (CARDIZEM) 100 MG injection Inject 120 mg into the vein daily.     . enalapril (VASOTEC) 10 MG tablet Take 10 mg by mouth daily.     Marland Kitchen albuterol (ACCUNEB) 0.63 MG/3ML nebulizer solution Inhale into the lungs.     Marland Kitchen apixaban (ELIQUIS) 5 MG TABS tablet Take by mouth.     Marland Kitchen atorvastatin (LIPITOR) 40 MG tablet Take by mouth.     . carvedilol (COREG) 6.25 MG tablet Take by mouth.     . Cholecalciferol 25 MCG (1000 UT) tablet Take by mouth.     . clopidogrel (PLAVIX) 75 MG tablet Take by mouth.     . Fluticasone-Umeclidin-Vilant (TRELEGY ELLIPTA) 100-62.5-25 MCG/INH AEPB Inhale into the lungs.     . pantoprazole (PROTONIX) 40 MG tablet Take by mouth.     . pravastatin (PRAVACHOL) 10 MG tablet Take by mouth.     . predniSONE (DELTASONE) 5 MG tablet Take by mouth.     . roflumilast (DALIRESP) 500 MCG TABS tablet Take by mouth.     Marland Kitchen rOPINIRole (REQUIP) 2 MG tablet Take by mouth.      Scheduled:  . albuterol  2.5 mg Nebulization Q6H  . atorvastatin  40 mg Oral Daily  . budesonide (PULMICORT) nebulizer solution  0.25 mg Nebulization BID  . carvedilol   6.25 mg Oral BID WC  . clopidogrel  75 mg Oral Daily  . enalapril  10 mg Oral Daily  . furosemide  40 mg Intravenous Q12H  . ipratropium  0.5 mg Nebulization Q6H  . LORazepam      . pantoprazole  40 mg Oral Daily  . pravastatin  10 mg Oral Daily  . predniSONE  5 mg Oral Q breakfast  . roflumilast  500 mcg Oral Daily  . rOPINIRole  2 mg Oral QHS   Infusions:  . azithromycin Stopped (03/21/20 1322)  . cefTRIAXone (ROCEPHIN)  IV Stopped (03/21/20 1208)   PRN: albuterol, ondansetron **OR** ondansetron (ZOFRAN) IV  Assessment: 86yo male admitted for acute on chronic respiratory failure, to transition from Eliquis to UFH for Afib.  Of note FOB+ but stool brown, Hgb stable.  Goal of Therapy:  Heparin level 0.3-0.7 units/ml aPTT 66-102 seconds Monitor platelets by anticoagulation protocol: Yes   Plan:  Will start heparin gtt at 1000 units/hr and monitor heparin levels, aPTT (while Eliquis affects anti-Xa), and CBC.  Wynona Neat, PharmD, BCPS  03/22/2020,12:55 AM

## 2020-03-22 NOTE — H&P (Addendum)
History and Physical    Cody Charles HLK:[id number] DOB: August 02, 1934 DOA: 03/21/2020  PCP: System, Pcp Not In  Patient coming from: Home.  And Hindi translator was used.  Patient speaks Hindi.  Patient just recently moved from [place] to stay with his daughter.  Chief Complaint: Shortness of breath.  HPI: Cody Charles is a 86 y.o. male with history of CAD status post CABG status post recent stenting in May 2021, COPD, previous history of tobacco abuse, paroxysmal atrial fibrillation, chronic anemia, hypertension presents to the ER admits in [facility] with complaint of shortness of breath.  Patient states he has been short of breath over the last 3 to 4 days mainly on exertion.  Denies any chest pain productive cough fever or chills.  Patient states he usually used to be on Lasix which has been stopped recently.  ED Course: In the ER patient was not had any chest pain.  Appeared short of breath chest x-ray shows possibility of new infiltrates.  Patient was started on empiric antibiotics and also given Lasix IV.  Patient admitted for acute respiratory failure hypoxia likely from CHF.  Lab work significant for hemoglobin of 9.6 which is at baseline.  BNP of 1231.  EKG shows normal sinus rhythm.  High sensitive troponin was 28 and 29.  Review of Systems: As per HPI, rest all negative.   Past Medical History:  Diagnosis Date  . AMI (acute myocardial infarction) ([place])   . Cancer ([place])    lung   . CHF (congestive heart failure) ([place])   . Coronary artery disease   . Diabetes mellitus without complication ([place])   . Pneumothorax on right     Past Surgical History:  Procedure Laterality Date  . CORONARY ARTERY BYPASS GRAFT    . CORONARY STENT PLACEMENT       reports that he has quit smoking. He has never used smokeless tobacco. He reports previous alcohol use. He reports that he does not use drugs.  No Known Allergies  Family History  Family history unknown: Yes    Prior to  Admission medications   Medication Sig Start Date End Date Taking? Authorizing Provider  diltiazem (CARDIZEM) 100 MG injection Inject 120 mg into the vein daily.   Yes [provider]  enalapril (VASOTEC) 10 MG tablet Take 10 mg by mouth daily.   Yes [provider]  albuterol (ACCUNEB) 0.63 MG/3ML nebulizer solution Inhale into the lungs.    [provider]  apixaban (ELIQUIS) 5 MG TABS tablet Take by mouth.    [provider]  atorvastatin (LIPITOR) 40 MG tablet Take by mouth.    [provider]  carvedilol (COREG) 6.25 MG tablet Take by mouth.    [provider]  Cholecalciferol 25 MCG (1000 UT) tablet Take by mouth.    [provider]  clopidogrel (PLAVIX) 75 MG tablet Take by mouth.    [provider]  Fluticasone-Umeclidin-Vilant (TRELEGY ELLIPTA) 100-62.5-25 MCG/INH AEPB Inhale into the lungs.    [provider]  pantoprazole (PROTONIX) 40 MG tablet Take by mouth.    [provider]  pravastatin (PRAVACHOL) 10 MG tablet Take by mouth.    [provider]  predniSONE (DELTASONE) 5 MG tablet Take by mouth.    [provider]  roflumilast (DALIRESP) 500 MCG TABS tablet Take by mouth.    [provider]  rOPINIRole (REQUIP) 2 MG tablet Take by mouth.    [provider]  Physical Exam: Constitutional: Moderately built and nourished. Vitals:   03/21/20 1830 03/21/20 1941 03/21/20 2141 03/21/20 2258  BP: (!) 131/91 116/67 115/76 (!) 113/91  Pulse: 84 92 88 84  Resp: (!) 22 (!) 24 20 20   Temp:   97.9 F (36.6 C) 98 F (36.7 C)  TempSrc:   Oral Oral  SpO2: 100% 99% 100%   Weight:       Eyes: Anicteric no pallor. ENMT: No discharge from the ears eyes nose or mouth. Neck: No mass felt.  No neck rigidity. Respiratory: No rhonchi or crepitations. Cardiovascular: S1-S2 heard. Abdomen: Soft nontender bowel sounds present. Musculoskeletal: No edema. Skin: No  rash. Neurologic: Alert awake oriented time place and person.  Moves all extremities. Psychiatric: Appears normal per normal affect.   Labs on Admission: I have personally reviewed following labs and imaging studies  CBC: Recent Labs  Lab 03/21/20 0943  WBC 12.1*  NEUTROABS 7.5  HGB 9.6*  HCT 31.0*  MCV 76.4*  PLT 778   Basic Metabolic Panel: Recent Labs  Lab 03/21/20 0943  NA 136  K 4.5  CL 104  CO2 23  GLUCOSE 148*  BUN 14  CREATININE 0.82  CALCIUM 9.5   GFR: CrCl cannot be calculated (Unknown ideal weight.). Liver Function Tests: Recent Labs  Lab 03/21/20 0943  AST 15  ALT 10  ALKPHOS 59  BILITOT 0.5  PROT 7.1  ALBUMIN 3.7   No results for input(s): LIPASE, AMYLASE in the last 168 hours. No results for input(s): AMMONIA in the last 168 hours. Coagulation Profile: No results for input(s): INR, PROTIME in the last 168 hours. Cardiac Enzymes: No results for input(s): CKTOTAL, CKMB, CKMBINDEX, TROPONINI in the last 168 hours. BNP (last 3 results) No results for input(s): PROBNP in the last 8760 hours. HbA1C: No results for input(s): HGBA1C in the last 72 hours. CBG: No results for input(s): GLUCAP in the last 168 hours. Lipid Profile: No results for input(s): CHOL, HDL, LDLCALC, TRIG, CHOLHDL, LDLDIRECT in the last 72 hours. Thyroid Function Tests: No results for input(s): TSH, T4TOTAL, FREET4, T3FREE, THYROIDAB in the last 72 hours. Anemia Panel: No results for input(s): VITAMINB12, FOLATE, FERRITIN, TIBC, IRON, RETICCTPCT in the last 72 hours. Urine analysis:    Component Value Date/Time   COLORURINE YELLOW 03/21/2020 1303   APPEARANCEUR CLEAR 03/21/2020 1303   LABSPEC 1.010 03/21/2020 1303   PHURINE 5.5 03/21/2020 1303   GLUCOSEU NEGATIVE 03/21/2020 1303   HGBUR NEGATIVE 03/21/2020 1303   BILIRUBINUR NEGATIVE 03/21/2020 1303   KETONESUR NEGATIVE 03/21/2020 1303   PROTEINUR NEGATIVE 03/21/2020 1303   NITRITE NEGATIVE 03/21/2020 1303    LEUKOCYTESUR NEGATIVE 03/21/2020 1303   Sepsis Labs: @LABRCNTIP (procalcitonin:4,lacticidven:4) ) Recent Results (from the past 240 hour(s))  SARS Coronavirus 2 by RT PCR (hospital order, performed in [place] hospital lab) Nasopharyngeal Nasopharyngeal Swab     Status: None   Collection Time: 03/21/20 11:29 AM   Specimen: Nasopharyngeal Swab  Result Value Ref Range Status   SARS Coronavirus 2 NEGATIVE NEGATIVE Final    Comment: (NOTE) SARS-CoV-2 target nucleic acids are NOT DETECTED.  The SARS-CoV-2 RNA is generally detectable in upper and lower respiratory specimens during the acute phase of infection. The lowest concentration of SARS-CoV-2 viral copies this assay can detect is 250 copies / mL. A negative result does not preclude SARS-CoV-2 infection and should not be used as the sole basis for treatment or other patient management decisions.  A negative result may occur with improper  specimen collection / handling, submission of specimen other than nasopharyngeal swab, presence of viral mutation(s) within the areas targeted by this assay, and inadequate number of viral copies (<250 copies / mL). A negative result must be combined with clinical observations, patient history, and epidemiological information.  Fact Sheet for Patients:   StrictlyIdeas.no  Fact Sheet for Healthcare Providers: BankingDealers.co.za  This test is not yet approved or  cleared by the [place] FDA and has been authorized for detection and/or diagnosis of SARS-CoV-2 by FDA under an Emergency Use Authorization (EUA).  This EUA will remain in effect (meaning this test can be used) for the duration of the COVID-19 declaration under Section 564(b)(1) of the Act, 21 U.S.C. section 360bbb-3(b)(1), unless the authorization is terminated or revoked sooner.  Performed at [facility], [address] [place], [place] [address]       Radiological Exams on Admission: DG Chest 2 View  Result Date: 03/21/2020 CLINICAL DATA:  Shortness of breath EXAM: CHEST - 2 VIEW COMPARISON:  None. FINDINGS: Right mid lung consolidation. Interstitial prominence. Lucency in the upper lungs may reflect bulla. No pleural effusion. No pneumothorax. Normal heart size. No acute osseous abnormality. IMPRESSION: Right mid lung consolidation suspicious for pneumonia. Follow-up is recommended to ensure resolution. Age-indeterminate interstitial prominence which may be chronic, reflect edema, or changes associated with pneumonia. Electronically Signed   By: Macy Mis M.D.   On: 03/21/2020 10:41    EKG: Independently reviewed.  Normal sinus rhythm.  Assessment/Plan Principal Problem:   Acute on chronic respiratory failure with hypoxia (HCC) Active Problems:   COPD (chronic obstructive pulmonary disease) (HCC)   PAF (paroxysmal atrial fibrillation) (HCC)   S/P CABG (coronary artery bypass graft)   Chronic anemia   Acute CHF (congestive heart failure) (HCC)   Acute respiratory failure with hypoxia ([place])    1. Acute on chronic respiratory failure likely from CHF for which patient is on IV Lasix patient is also on ACE inhibitor beta-blockers follow intake output and daily weights.  Since there was some concern for possible pneumonia and x-ray empiric antibiotics have been started.  Will check procalcitonin if negative will discontinue antibiotics.  Patient's last EF was around 40% as per the cardiology notes in care everywhere. 2. Paroxysmal atrial fibrillation on blood thinner which we have placed with heparin.  And Coreg for rate control. 3. Hypertension on ACE inhibitor beta-blocker blocker. 4. Chronic anemia hemoglobin appears to be at baseline.  Since patient stool for occult blood was positive we will check serial CBCs.  I do not think patient is having active GI bleed. 5. COPD not actively wheezing continue hemodialysis. 6. CAD status  post stenting on aspirin Plavix statins beta-blockers. 7. History of Addison's disease on steroids.  Since patient has acute CHF will need close monitoring for inpatient status.  Need to get patient's old charts from previous hospital to get further history.   DVT prophylaxis: Lovenox. Code Status: Full code. Family Communication: Discussed with patient. Disposition Plan: Home. Consults called: GI. Admission status: Inpatient.   Rise Patience MD [facility] Pager [phone].  If 7PM-7AM, please contact night-coverage [email or web address] Password [facility]  03/22/2020, 12:53 AM

## 2020-03-22 NOTE — Progress Notes (Addendum)
Per HPI: Cody Charles is a 86 y.o. male with history of CAD status post CABG status post recent stenting in May 2021, COPD, previous history of tobacco abuse, paroxysmal atrial fibrillation, chronic anemia, hypertension presents to the ER admits in [facility] with complaint of shortness of breath.  Patient states he has been short of breath over the last 3 to 4 days mainly on exertion.  Denies any chest pain productive cough fever or chills.  Patient states he usually used to be on Lasix which has been stopped recently.  ED Course: In the ER patient was not had any chest pain.  Appeared short of breath chest x-ray shows possibility of new infiltrates.  Patient was started on empiric antibiotics and also given Lasix IV.  Patient admitted for acute respiratory failure hypoxia likely from CHF.  Lab work significant for hemoglobin of 9.6 which is at baseline.  BNP of 1231.  EKG shows normal sinus rhythm.  High sensitive troponin was 28 and 29.  -Patient was admitted with acute on chronic respiratory failure secondary to suspected acute systolic CHF exacerbation.  Prior 2D echocardiogram performed on 5/21 in [place] with LVEF 35% noted.  He has been started on IV Lasix for diuresis.  He was noted to have tachycardia with A. fib/RVR this a.m. and was given 1 dose of IV metoprolol and has been started on some oral Cardizem 30 mg every 6 hours.  His blood pressures were initially noted to be soft and therefore Coreg was held at this time.  TSH is 0.604.  Procalcitonin was low and therefore azithromycin and Rocephin have been discontinued.  It appears that the findings on his chest x-ray are probably related to his previous lung cancer history with similar noted findings on CT scan also noted back in [place].  He is apparently here to visit family and there is some question of whether he has been taking his home Lasix.  I suspect that he should improve with diuresis on Lasix, but will need closer evaluation  and monitoring.  Cardiology consultation obtained with further recommendations appreciated.  No further GI bleeding currently noted, will continue to follow CBC and maintain on heparin drip for now.  If H&H remained stable, will plan to resume home anticoagulation at that time.  Discussed case with patient's granddaughter on phone and at bedside.  Addendum: After discussion with the granddaughter, it appears that patient has not taken his every other day torsemide or spironolactone as noted at a Cardiology visit in late June. This is the likely cause of his symptoms. He is diuresing well with lasix and has returned to sinus rhythm, but with ongoing soft bp readings. Will discontinue Cardizem and Coreg and place on metoprolol 12.5mg  bid and monitor closely. Plan to resume home Eliquis by am if hemoglobin stable and no overt bleeding noted. FOBT positive in ED.  Total care time: 45 minutes.

## 2020-03-22 NOTE — Progress Notes (Signed)
Patient noted to have elevated heart rate on telemetry.  EKG performed and showed STEMI rate of 163 patient was moving.  I immediately called Rapid response RN.  Artifact noted and a repeat EKG performed which shows Afib RVR.  Patient denies chest pain or palpitations.  Dr. Manuella Ghazi came to the department and I notified him verbally and he placed new orders to be carried out.  Nursing staff to continue to monitor

## 2020-03-22 NOTE — Progress Notes (Signed)
ANTICOAGULATION CONSULT NOTE - Follow-Up Consult  Pharmacy Consult for heparin Indication: atrial fibrillation  No Known Allergies  Patient Measurements: Weight: 70.2 kg (154 lb 12.8 oz)  Vital Signs: Temp: 97.6 F (36.4 C) (07/22 1655) Temp Source: Oral (07/22 1655) BP: 106/63 (07/22 1655) Pulse Rate: 90 (07/22 1655)  Labs: Recent Labs    03/21/20 0943 03/21/20 0943 03/21/20 1129 03/22/20 0052 03/22/20 0052 03/22/20 0252 03/22/20 0958 03/22/20 1813  HGB 9.6*   < >  --  10.8*   < > 10.6* 11.0*  --   HCT 31.0*   < >  --  34.1*  --  33.4* 34.9*  --   PLT 349   < >  --  403*  --  383 393  --   APTT  --   --   --   --   --   --  78* 99*  HEPARINUNFRC  --   --   --   --   --   --  0.87*  --   CREATININE 0.82  --   --   --   --   --   --  1.24  TROPONINIHS 28*   < > 29* 39*  --  35*  --   --    < > = values in this interval not displayed.   Infusions:  . heparin 1,000 Units/hr (03/22/20 0128)   Assessment: 86yo male admitted for acute on chronic respiratory failure, to transition from Eliquis to UFH for Afib.  Of note FOB+ but stool brown, Hgb stable.  aPTT this morning is therapeutic (aPTT 78, goal of 66-102), Heparin level falsely elevated with recent Apixaban use. CBC stable.   Confirm PTT therapeutic. Cont same rate  Goal of Therapy:  Heparin level 0.3-0.7 units/ml aPTT 66-102 seconds Monitor platelets by anticoagulation protocol: Yes   Plan:  - Continue Heparin at 1000 units/hr - Will continue to monitor for any signs/symptoms of bleeding and will follow up with  Daily PTT/HL  Onnie Boer, PharmD, BCIDP, AAHIVP, CPP Infectious Disease Pharmacist 03/22/2020 7:04 PM

## 2020-03-22 NOTE — Significant Event (Signed)
Rapid Response Event Note  Overview: Called d/t STEMI result on EKG done this AM d/t tachycardia. Pt was moving during EKG so a new one was done showing Afib with RVR-no STE.   Initial Focused Assessment: Pt laying in bed in no distress. Pt spoken to via interpreter. Pt denies chest pain but does say he is a little SOB. Lungs clear, diminished in bases. Skin cool to touch. HR-120-130s(Afib), BP-102/75, RR-20, SpO2-97% on RA.  Interventions: No RRT interventions needed at this time.  Plan of Care (if not transferred): Alert MD of tachycardia and SOB. Continue to monitor pt. Call RRT if further assistance needed.  Event Summary:  Called: [phone] Arrived: 0645 Ended: 0655   Dillard Essex

## 2020-03-22 NOTE — Consult Note (Addendum)
Cardiology Consultation:   Patient ID: Cody Charles; [id number]; [birth year]   Admit date: 03/21/2020 Date of Consult: 03/22/2020  Primary Care Provider: System, [place] Not In Primary Cardiologist: Cody Munroe, MD new Cody Cody Charles [address] [place] [address]  [phone]  [phone] (Fax) Primary Electrophysiologist:  None   Patient Profile:   Cody Charles is a 86 y.o. male with a hx of STEMI 01/28/2020 in [place] s/p PCI RCA (failed stent CFX), CABG x 6 1994 w/ LIMA-LAD, SVG-CFX, SVG-OM; CHF, hx AKI, lung CA s/p XRT, DM, HTN, HLD, COPD, PAF, mild AI, Addison's dz, who is being seen today for the evaluation of CHF at the request of Cody Charles.  History of Present Illness:   Cody Charles weighs himself daily.  He states that he has only gained about 3 pounds recently.  He is visiting down here from [place], plans to go back in the next few days.  He is compliant with his medications, but his cardiologist told him that he could wean and discontinue his home dose of torsemide, so he has not been on a recent diuretic.  He is a vegetarian and the family cooks for him.  However, his diet includes sodium, possibly significant amounts.    He started getting more short of breath.  He could not sleep.  He has restless legs and complains of leg pain, so PND is not entirely clear.  He definitely has orthopnea.  He complains of a cough as well as dyspnea on exertion.  Since being in the hospital, he has put out over 2 L, but does not feel his shortness of breath is significantly improved.  He has not had chest pain.   Past Medical History:  Diagnosis Date  . AMI (acute myocardial infarction) ([place])   . Cancer ([place])    lung   . CHF (congestive heart failure) ([place])   . Coronary artery disease 1994   hx CABG 2004, SVG-CFX  . Diabetes mellitus without complication ([place])   . Pneumothorax on right     Past Surgical History:  Procedure Laterality Date  . CORONARY  ARTERY BYPASS GRAFT    . CORONARY STENT PLACEMENT      CARDIAC PROCEDURES: . CARDIAC CATHERIZATION 01/2003  SVG-CX  . CARDIAC CATHERIZATION 08/09/2003  PATENT BYPASS SMALL VESSEL D2.  . CARDIAC CATHERIZATION 01/2005  LIMA-LAD V6--> CX OK. SMALL VESSEL D2  . CARDIAC CATHERIZATION 05/2007  STENT VG-OM [facility]  . CARDIAC CATHERIZATION 10/2007  STENT LCX  . CARDIAC CATHERIZATION 04/2008  SMALL VESSEL D2 RCA  . CARDIAC CATHERIZATION 08/2011  PATENT  . CORONARY ARTERY BYPASS GRAFT 1994  CAD S/P CABG     Prior to Admission medications   Medication Sig Start Date End Date Taking? Authorizing Provider  acetaminophen (TYLENOL) 500 MG tablet Take 500 mg by mouth every 8 (eight) hours as needed for mild pain.   Yes [provider]  albuterol (ACCUNEB) 0.63 MG/3ML nebulizer solution Inhale 1 ampule into the lungs every 6 (six) hours as needed for wheezing or shortness of breath.    Yes [provider]  apixaban (ELIQUIS) 5 MG TABS tablet Take 5 mg by mouth 2 (two) times daily.    Yes [provider]  atorvastatin (LIPITOR) 40 MG tablet Take 40 mg by mouth daily.    Yes [provider]  carvedilol (COREG) 6.25 MG tablet Take 6.25 mg by mouth 2 (two) times daily with a meal.  Yes [provider]  Cholecalciferol 25 MCG (1000 UT) tablet Take 1,000 Units by mouth daily.    Yes [provider]  clopidogrel (PLAVIX) 75 MG tablet Take 75 mg by mouth daily.    Yes [provider]  diltiazem (CARDIZEM CD) 180 MG 24 hr capsule Take 180 mg by mouth daily. 12/22/19  Yes [provider]  enalapril (VASOTEC) 10 MG tablet Take 10 mg by mouth daily.   Yes [provider]  Fluticasone-Umeclidin-Vilant (TRELEGY ELLIPTA) 100-62.5-25 MCG/INH AEPB Inhale 1 puff into the lungs daily.    Yes [provider]  pantoprazole (PROTONIX) 40 MG tablet Take 40 mg by mouth daily.    Yes [provider]  predniSONE  (DELTASONE) 5 MG tablet Take 5 mg by mouth daily with breakfast.    Yes [provider]  roflumilast (DALIRESP) 500 MCG TABS tablet Take 500 mcg by mouth daily.    Yes [provider]  rOPINIRole (REQUIP) 2 MG tablet Take 2 mg by mouth at bedtime.    Yes [provider]  spironolactone (ALDACTONE) 25 MG tablet Take 25 mg by mouth daily. 03/12/20  Yes [provider]  diltiazem (CARDIZEM) 100 MG injection Inject 120 mg into the vein daily. Patient not taking: Reported on 03/22/2020    [provider]  pravastatin (PRAVACHOL) 10 MG tablet Take by mouth. Patient not taking: Reported on 03/22/2020    [provider]    Inpatient Medications: Scheduled Meds: . atorvastatin  40 mg Oral q1800  . budesonide (PULMICORT) nebulizer solution  0.25 mg Nebulization BID  . clopidogrel  75 mg Oral Daily  . furosemide  40 mg Intravenous BID  . ipratropium  0.5 mg Nebulization BID  . metoprolol tartrate  12.5 mg Oral BID  . pantoprazole  40 mg Oral Daily  . predniSONE  5 mg Oral Q breakfast  . roflumilast  500 mcg Oral Daily  . rOPINIRole  2 mg Oral QHS   Continuous Infusions: . heparin 1,000 Units/hr (03/22/20 0128)   PRN Meds: acetaminophen, levalbuterol, ondansetron **OR** ondansetron (ZOFRAN) IV  Allergies:   No Known Allergies  Social History:   Social History   Socioeconomic History  . Marital status: Married    Spouse name: Not on file  . Number of children: Not on file  . Years of education: Not on file  . Highest education level: Not on file  Occupational History  . Not on file  Tobacco Use  . Smoking status: Former Research scientist (life sciences)  . Smokeless tobacco: Never Used  Vaping Use  . Vaping Use: Never used  Substance and Sexual Activity  . Alcohol use: Not Currently  . Drug use: Never  . Sexual activity: Not on file  Other Topics Concern  . Not on file  Social History Narrative  . Not on file   Social Determinants of Health    Financial Resource Strain:   . Difficulty of Paying Living Expenses:   Food Insecurity:   . Worried About Charity fundraiser in the Last Year:   . Arboriculturist in the Last Year:   Transportation Needs:   . Film/video editor (Medical):   Marland Kitchen Lack of Transportation (Non-Medical):   Physical Activity:   . Days of Exercise per Week:   . Minutes of Exercise per Session:   Stress:   . Feeling of Stress :   Social Connections:   . Frequency of Communication with Friends and Family:   .  Frequency of Social Gatherings with Friends and Family:   . Attends Religious Services:   . Active Member of Clubs or Organizations:   . Attends Archivist Meetings:   Marland Kitchen Marital Status:   Intimate Partner Violence:   . Fear of Current or Ex-Partner:   . Emotionally Abused:   Marland Kitchen Physically Abused:   . Sexually Abused:     Family History:   Family History  Family history unknown: Yes   Family Status:  No family status information on file.    ROS:  Please see the history of present illness.  All other ROS reviewed and negative.     Physical Exam/Data:   Vitals:   03/22/20 0733 03/22/20 0734 03/22/20 1216 03/22/20 1655  BP:    (!) 106/63  Pulse:   87 90  Resp:      Temp:   98.2 F (36.8 C) 97.6 F (36.4 C)  TempSrc:   Oral Oral  SpO2: 94% 96%    Weight:       No intake or output data in the 24 hours ending 03/22/20 1732  Last 3 Weights 03/21/2020  Weight (lbs) 154 lb 12.8 oz  Weight (kg) 70.217 kg     There is no height or weight on file to calculate BMI.   General:  Well nourished, well developed, male in no acute distress HEENT: normal Lymph: no adenopathy Neck: JVD - 10 cm Endocrine:  No thryomegaly Vascular: No carotid bruits; 4/4 extremity pulses 2+  Cardiac:  normal S1, S2; RRR; no murmur Lungs:  Dense rales bilaterally, no wheezing, rhonchi    Abd: soft, nontender, no hepatomegaly  Ext: no edema Musculoskeletal:  No deformities, BUE and BLE strength  normal and equal Skin: warm and dry  Neuro:  CNs 2-12 intact, no focal abnormalities noted Psych:  Normal affect   EKG:  The EKG was personally reviewed and demonstrates: 7/22 ECG is atrial fibrillation, RVR, heart rate 123, possible LVH 7/21 ECG is sinus rhythm atrial fibrillation and I had a discharge Telemetry:  Telemetry was personally reviewed and demonstrates:  SR>> Afib, RVR at first >> SR >> Afib currently with controlled rate.   CV studies:   ECHO: 01/28/2020 CONCLUSION The left ventricle is normal size. There is normal left ventricular wall thickness. There is inferior and lateral wall akinesis. The estimated EF is 35%. The right ventricle is normal size. The right ventricular systolic function is normal. Dilated left atrium. Sclerotic aortic valve. Mild to moderate aortic regurgitation. There is no aortic valvular stenosis. Sclerotic mitral valve. Moderate mitral regurgitation is present. The tricuspid valve is normal in structure. There is mild tricuspid regurgitation. The aortic root is moderately enlarged at 4.4 cm. Left Ventricle The left ventricle is normal size. There is normal left ventricular wall thickness. There is inferior and lateral wall akinesis. The estimated EF is 35%. Right Ventricle The right ventricle is normal size. The right ventricular systolic function is normal. Atria Dilated left atrium. The right atrium size is normal. Aortic Valve Sclerotic aortic valve. Mild to moderate aortic regurgitation. There is no aortic valvular stenosis. Mitral Valve Sclerotic mitral valve. Moderate mitral regurgitation is present. There is no mitral valve stenosis. Tricuspid Valve The tricuspid valve is normal in structure. There is mild tricuspid regurgitation. Pulmonic Valve Pulmonary valve is not well visualized but no significant valvular abnormalities noted. There is no significant pulmonic valvular regurgitation. Great Vessels The aortic root is moderately enlarged at 4.4 cm.  Pericardium There is  no pericardial effusion.   2D Dimensions LVEF MOD A4C 30 % LVEF MOD A2C 34 % LVOT Diameter 2.08 (1.8-2.4cm) IVC 1.5 cm Aortic Root 4.22 cm Simpson's LVEF 32.00 % M-Mode Dimensions Aortic Root 4.54 (2.2-3.7cm) Aortic Cusp Exc 1.28 (1.5-2.0cm) TAPSE 1.6 (<1.7) Volumes Left Atrial Volume (Systole): Single Plane 4 CH 37.8 mL Single Plane 2 CH 67.0 mL RA Volume 16.7 mL Aortic Valve AoV Peak Vel. 99.6 cm/s AI PHT 692 ms AO Peak GR. 4.0 mmHg AO Mean GR. 2.3 mmHg LV Max PG 3.61 mmHg AO V2 Mean 72.49 cm/s LV Mean PG 2.0 mmHg AO V2 VTI 13.3 cm LV Max 94.8 cm/s AVA (VTI) 3.73 cm2 LV Mean 65.9 cm/s LV V1 VTI 14.61 cm Mitral Valve MV Peak Gr. 68.6 mmHg MV PHT 43 ms MV Mean Gr. 3.8 mmHg MVA (PHT) 5.07 cm2 MV E Max Vel. 124.52 cm/s MV Decel. Time 145 ms TDI E Lateral 7.21 RV S' 14.0 cm/s E Medial 9.28 Pulmonary Valve PV Peak Velocity 145.8 cm/s Tricuspid Valve TR P. Velocity 261.1 cm/s TR Peak Gr. 27.3 mmHg    CATH:  Result Date: 01/28/2020 1. Coronary and bypass graft angiography were performed emergently in the setting of acute inferoposterior myocardial infarction.   Angiography revealed a patent left internal mammary artery graft to the LAD, total occlusion of the native right coronary artery with left-to-right collaterals, severe thrombotic left main stenosis, moderate ostial circumflex stenosis, distal circumflex occlusion. Saphenous vein grafts were occluded.   2. Percutaneous transluminal coronary angioplasty of the severe thrombotic left main coronary artery stenosis was performed with deployment of 1 drug-eluting intracoronary stent, achievement of excellent angiographic result with 0% residual stenosis and smooth arterial lumen.   Balloon dilatation of the ostium of the circumflex was performed as well with good angiographic results. Prompt and complete TIMI grade III flow achieved into the left circumflex and its obtuse marginal branches at the conclusion of the procedure.   LEXISCAN  NUCLEAR STRESS TEST 01/18/2020 Nuclear findings:  The overall quality of the study is fair due to artifact.  There is mild artifact due to GI.  The left ventricle cavity was normal on rest and normal post-stress.  Wall motion and thickening is normal. By Gated SPECT, the post-stress global EF was calculated at 53%.  No perfusion defect is identified.  Impression:   1. The stress ECG wasnegative.  2. Myocardial perfusion imaging is normal. No evidence of ischemia.   Laboratory Data:   Chemistry Recent Labs  Lab 03/21/20 0943  NA 136  K 4.5  CL 104  CO2 23  GLUCOSE 148*  BUN 14  CREATININE 0.82  CALCIUM 9.5  GFRNONAA >60  GFRAA >60  ANIONGAP 9    Lab Results  Component Value Date   ALT 10 03/21/2020   AST 15 03/21/2020   ALKPHOS 59 03/21/2020   BILITOT 0.5 03/21/2020   Hematology Recent Labs  Lab 03/22/20 0052 03/22/20 0252 03/22/20 0958  WBC 10.1 10.2 11.0*  RBC 4.57 4.42 4.63  HGB 10.8* 10.6* 11.0*  HCT 34.1* 33.4* 34.9*  MCV 74.6* 75.6* 75.4*  MCH 23.6* 24.0* 23.8*  MCHC 31.7 31.7 31.5  RDW 19.1* 18.8* 18.8*  PLT 403* 383 393   Cardiac Enzymes High Sensitivity Troponin:   Recent Labs  Lab 03/21/20 0943 03/21/20 1129 03/22/20 0052 03/22/20 0252  TROPONINIHS 28* 29* 39* 35*      BNP Recent Labs  Lab 03/21/20 0943  BNP 1,231.1*    TSH:  Lab Results  Component Value Date   TSH 0.604 03/22/2020   Lipids:No results found for: CHOL, HDL, LDLCALC, LDLDIRECT, TRIG, CHOLHDL HgbA1c:No results found for: HGBA1C Magnesium:  Magnesium  Date Value Ref Range Status  03/22/2020 2.0 1.7 - 2.4 mg/dL Final    Comment:    Performed at [facility], [place] [address], [place], [place] [address]     Radiology/Studies:  DG Chest 2 View  Result Date: 03/21/2020 CLINICAL DATA:  Shortness of breath EXAM: CHEST - 2 VIEW COMPARISON:  None. FINDINGS: Right mid lung consolidation. Interstitial prominence. Lucency in the upper lungs may reflect bulla. No  pleural effusion. No pneumothorax. Normal heart size. No acute osseous abnormality. IMPRESSION: Right mid lung consolidation suspicious for pneumonia. Follow-up is recommended to ensure resolution. Age-indeterminate interstitial prominence which may be chronic, reflect edema, or changes associated with pneumonia. Electronically Signed   By: Macy Mis M.D.   On: 03/21/2020 10:41    Assessment and Plan:   1.  Acute on chronic systolic CHF: -His EF was 35% at the time of his MI in [place] 01/28/2020. -He is compliant with his medications, but the [place] physicians had given him permission to wean and discontinue his torsemide. -He is net -1.3 L since admission, need daily weights -Continue IV Lasix for now -Suspect he has some chronic interstitial disease and a history of lung cancer with a right midlung consolidation seen on chest x-ray  2.  History of lung cancer: -He has a history of right upper lobe lung cancer treated with radiation therapy. -Currently, he is followed by oncology, no current chemo/radiation therapies -He has a history of loculated right apical pneumothorax and infiltrate within the right midlung zone.  This is described on chest x-ray dated 03/16/2020 from [place]  3.  CAD: -His cardiac catheterization report from his STEMI 01/28/2020 as above. -His LIMA to LAD is patent, all SVGs are occluded -He had DES to the left main with PTCA to the CFX -Because of the need for anticoagulation, he is on Plavix without aspirin -He is also on beta-blocker and a statin -No ongoing ischemic symptoms  4.  PAF: -He was in sinus rhythm on admission -he went into atrial fibrillation, RVR.  He then went back into sinus rhythm, but was currently in atrial fibrillation with a controlled rate -He was taken off his home dose of Coreg 6.25 mg twice daily and put on Lopressor 12.5 mg twice daily -His heart rate is currently controlled, systolic blood pressure in the 90s at times  but currently 106/63. -Continue Lopressor for now, decide on resuming his home Coreg or changing to Toprol-XL closer to discharge - CHA2DS2-VASc equals 6 (age x 2, CHF, CAD, HTN, DM) -Resume Eliquis when able. -Currently on heparin, MD advise if any invasive cardiac procedures are planned  Otherwise, per IM Principal Problem:   Acute on chronic respiratory failure with hypoxia ([place]) Active Problems:   COPD (chronic obstructive pulmonary disease) ([facility])   PAF (paroxysmal atrial fibrillation) ([facility])   S/P CABG (coronary artery bypass graft)   Chronic anemia   Acute CHF (congestive heart failure) ([place])   Acute respiratory failure with hypoxia ([place])     For questions or updates, please contact [facility] Please consult [email or web address] for contact info under Cardiology/STEMI.   Cody Speak, PA-C  03/22/2020 5:32 PM  Patient seen and examined with Rosaria Ferries PA-C.  Agree as above, with the following exceptions and changes as noted below. He is mostly  concerned with lower extremity pain from restless leg syndrome, and is not currently taking his prescribed medication for this. He also notes a significant cough. He eats a typical [place] diet, and I have reviewed this in great detail with his granddaughter and daughter to optimize a no salt diet for him. Gen: NAD, CV: RRR, no murmurs, JVD to mid 1/3 of neck sitting upright, Lungs: coarse bilaterally, Abd: soft, Extrem: Warm, well perfused, no edema, Neuro/Psych: alert and oriented x 3, normal mood and affect. All available labs, radiology testing, previous records reviewed. Agree with above. Continue lasix. Goal will be to get him back to [place] to follow up with his usual team next week. He is in atrial fibrillation now which is a likely contributor to HF, but rate is reasonably controlled. No plan for invasive procedures based on current data, can resume home oral Eliquis if hemoglobin stable and no bleeding.   Cody Munroe, MD

## 2020-03-23 LAB — BASIC METABOLIC PANEL
Anion gap: 11 (ref 5–15)
BUN: 30 mg/dL — ABNORMAL HIGH (ref 8–23)
CO2: 22 mmol/L (ref 22–32)
Calcium: 8.3 mg/dL — ABNORMAL LOW (ref 8.9–10.3)
Chloride: 103 mmol/L (ref 98–111)
Creatinine, Ser: 1.2 mg/dL (ref 0.61–1.24)
GFR calc Af Amer: >60 mL/min (ref >60)
GFR calc non Af Amer: 56 mL/min — ABNORMAL LOW (ref >60)
Glucose, Bld: 97 mg/dL (ref 70–99)
Potassium: 3.5 mmol/L (ref 3.5–5.1)
Sodium: 136 mmol/L (ref 135–145)

## 2020-03-23 LAB — MAGNESIUM: Magnesium: 2.1 mg/dL (ref 1.7–2.4)

## 2020-03-23 LAB — CBC
HCT: 29.7 % — ABNORMAL LOW (ref 39.0–52.0)
Hemoglobin: 9.4 g/dL — ABNORMAL LOW (ref 13.0–17.0)
MCH: 23.9 pg — ABNORMAL LOW (ref 26.0–34.0)
MCHC: 31.6 g/dL (ref 30.0–36.0)
MCV: 75.4 fL — ABNORMAL LOW (ref 80.0–100.0)
Platelets: 364 10*3/uL (ref 150–400)
RBC: 3.94 MIL/uL — ABNORMAL LOW (ref 4.22–5.81)
RDW: 18.9 % — ABNORMAL HIGH (ref 11.5–15.5)
WBC: 8.7 10*3/uL (ref 4.0–10.5)
nRBC: 0 % (ref 0.0–0.2)

## 2020-03-23 LAB — HEMOGLOBIN AND HEMATOCRIT, BLOOD
HCT: 31.6 % — ABNORMAL LOW (ref 39.0–52.0)
Hemoglobin: 9.9 g/dL — ABNORMAL LOW (ref 13.0–17.0)

## 2020-03-23 LAB — APTT: aPTT: 106 seconds — ABNORMAL HIGH (ref 24–36)

## 2020-03-23 LAB — HEPARIN LEVEL (UNFRACTIONATED): Heparin Unfractionated: 0.63 IU/mL (ref 0.30–0.70)

## 2020-03-23 MED ORDER — METOPROLOL TARTRATE 25 MG PO TABS: 12.5000 mg | ORAL_TABLET | Freq: Two times a day (BID) | ORAL | 1 refills | Status: AC

## 2020-03-23 MED ORDER — SPIRONOLACTONE 25 MG PO TABS: 25.0000 mg | ORAL_TABLET | Freq: Every day | ORAL | 1 refills | Status: AC

## 2020-03-23 MED ORDER — TORSEMIDE 20 MG PO TABS: 20.0000 mg | ORAL_TABLET | ORAL | 1 refills | Status: AC

## 2020-03-23 NOTE — Discharge Summary (Signed)
Physician Discharge Summary  Cody Charles WIO:[id number] DOB: 10-13-1933 DOA: 03/21/2020  PCP: System, Pcp Not In  Admit date: 03/21/2020  Discharge date: 03/23/2020  Admitted From:Home  Disposition:  Home  Recommendations for Outpatient Follow-up:  1. Follow up with PCP and Cardiologist in [place] in 1 week. Please schedule appointment 2. Recommend referral to GI outpatient in 1-2 weeks to follow up re: positive stool occult in setting of Plavix and Eliquis use. No active bleeding noted while admitted. 3. Please obtain BMP/CBC in one week 4. Discontinue Coreg and remain on metoprolol 12.5mg  BID due to soft blood pressure readings 5. Discontinue Enalapril due to soft blood pressure 6. Restart Spironolactone 25mg  daily along with Torsemide 20mg  every other day as noted at last Cardiology visit 7. Advised on low sodium diet  Home Health:None  Equipment/Devices:None  Discharge Condition:Stable  CODE STATUS: Full  Diet recommendation: Heart Healthy  Brief/Interim Summary: Per HPI: Cody Charles a 86 y.o.malewithhistory of CAD status post CABG status post recent stenting in May 2021, COPD, previous history of tobacco abuse, paroxysmal atrial fibrillation, chronic anemia, hypertension presents to the ER admits in [facility] with complaint of shortness of breath. Patient states he has been short of breath over the last 3 to 4 days mainly on exertion. Denies any chest pain productive cough fever or chills. Patient states he usually used to be on Lasix which has been stopped recently.  ED Course:In the ER patient was not had any chest pain. Appeared short of breath chest x-ray shows possibility of new infiltrates. Patient was started on empiric antibiotics and also given Lasix IV. Patient admitted for acute respiratory failure hypoxia likely from CHF. Lab work significant for hemoglobin of 9.6 which is at baseline. BNP of 1231. EKG shows normal sinus rhythm. High sensitive  troponin was 28 and 29.  -Patient was admitted with acute on chronic respiratory failure secondary to suspected acute systolic CHF exacerbation.  He had decided to stop taking his home spironolactone and torsemide altogether. Prior 2D echocardiogram performed on 5/21 in [place] with LVEF 35% noted.  He had been started on IV Lasix for diuresis and appears to have diuresed about 2.5L of fluid.  He was noted to have transient tachycardia with A. fib/RVR this a.m. and was given 1 dose of IV metoprolol and has remained in sinus rhythm through the entirety of the visit.  His blood pressures were initially noted to be soft and therefore Coreg was discontinued and he was transitioned to metoprolol.  TSH is 0.604.  Procalcitonin was low and therefore azithromycin and Rocephin have been discontinued.  It appears that the findings on his chest x-ray are probably related to his previous lung cancer history with similar noted findings on CT scan also noted back in [place].  He has been seen by Cardiology with recommendations to remain on medications as prescribed above, to include spironolactone and torsemide and follow a low sodium diet and weigh daily. His hemoglobin has remained stable and he has had no overt bleeding while here, therefore he will resume his usual home Eliquis and heparin drip will be discontinued. There was an initial concern of dark stools and stool occult was positive. I have recommended that he follow up with GI once he goes back to [place] for a thorough investigation. All of this information was relayed to his granddaughter who is in the medical field and understands instructions.  Patient is now stable for discharge. He was able to ambulate this am  without hypoxemia or dyspnea. No other acute events noted during this stay.  Discharge Diagnoses:  Principal Problem:   Acute on chronic respiratory failure with hypoxia (HCC) Active Problems:   COPD (chronic obstructive pulmonary  disease) (HCC)   PAF (paroxysmal atrial fibrillation) (HCC)   S/P CABG (coronary artery bypass graft)   Chronic anemia   Acute CHF (congestive heart failure) (HCC)   Acute respiratory failure with hypoxia (HCC)  Principle discharge diagnosis: Acute hypoxemic respiratory failure secondary to acute on chronic systolic CHF exacerbation in the setting of medication noncompliance.  Discharge Instructions  Discharge Instructions    Diet - low sodium heart healthy   Complete by: As directed    Increase activity slowly   Complete by: As directed      Allergies as of 03/23/2020   No Known Allergies     Medication List    STOP taking these medications   carvedilol 6.25 MG tablet Commonly known as: COREG   diltiazem 100 MG injection Commonly known as: CARDIZEM   enalapril 10 MG tablet Commonly known as: VASOTEC   pravastatin 10 MG tablet Commonly known as: PRAVACHOL     TAKE these medications   acetaminophen 500 MG tablet Commonly known as: TYLENOL Take 500 mg by mouth every 8 (eight) hours as needed for mild pain.   albuterol 0.63 MG/3ML nebulizer solution Commonly known as: ACCUNEB Inhale 1 ampule into the lungs every 6 (six) hours as needed for wheezing or shortness of breath.   apixaban 5 MG Tabs tablet Commonly known as: ELIQUIS Take 5 mg by mouth 2 (two) times daily.   atorvastatin 40 MG tablet Commonly known as: LIPITOR Take 40 mg by mouth daily.   Cholecalciferol 25 MCG (1000 UT) tablet Take 1,000 Units by mouth daily.   clopidogrel 75 MG tablet Commonly known as: PLAVIX Take 75 mg by mouth daily.   diltiazem 180 MG 24 hr capsule Commonly known as: CARDIZEM CD Take 180 mg by mouth daily.   metoprolol tartrate 25 MG tablet Commonly known as: LOPRESSOR Take 0.5 tablets (12.5 mg total) by mouth 2 (two) times daily.   pantoprazole 40 MG tablet Commonly known as: PROTONIX Take 40 mg by mouth daily.   predniSONE 5 MG tablet Commonly known as:  DELTASONE Take 5 mg by mouth daily with breakfast.   roflumilast 500 MCG Tabs tablet Commonly known as: DALIRESP Take 500 mcg by mouth daily.   rOPINIRole 2 MG tablet Commonly known as: REQUIP Take 2 mg by mouth at bedtime.   spironolactone 25 MG tablet Commonly known as: ALDACTONE Take 1 tablet (25 mg total) by mouth daily.   torsemide 20 MG tablet Commonly known as: Demadex Take 1 tablet (20 mg total) by mouth every other day.   Trelegy Ellipta 100-62.5-25 MCG/INH Aepb Generic drug: Fluticasone-Umeclidin-Vilant Inhale 1 puff into the lungs daily.       Follow-up Information    pcp. Schedule an appointment as soon as possible for a visit in 1 week(s).        cardiology Follow up in 1 week(s).              No Known Allergies  Consultations:  Cardiology   Procedures/Studies: DG Chest 2 View  Result Date: 03/21/2020 CLINICAL DATA:  Shortness of breath EXAM: CHEST - 2 VIEW COMPARISON:  None. FINDINGS: Right mid lung consolidation. Interstitial prominence. Lucency in the upper lungs may reflect bulla. No pleural effusion. No pneumothorax. Normal heart size. No acute osseous abnormality.  IMPRESSION: Right mid lung consolidation suspicious for pneumonia. Follow-up is recommended to ensure resolution. Age-indeterminate interstitial prominence which may be chronic, reflect edema, or changes associated with pneumonia. Electronically Signed   By: Macy Mis M.D.   On: 03/21/2020 10:41     Discharge Exam: Vitals:   03/23/20 0841 03/23/20 0901  BP:  (!) 132/75  Pulse:    Resp:  (!) 24  Temp:  97.9 F (36.6 C)  SpO2: 97%    Vitals:   03/23/20 0803 03/23/20 0840 03/23/20 0841 03/23/20 0901  BP:    (!) 132/75  Pulse: 96     Resp:    (!) 24  Temp:    97.9 F (36.6 C)  TempSrc:    Oral  SpO2:  97% 97%   Weight:        General: Pt is alert, awake, not in acute distress Cardiovascular: RRR, S1/S2 +, no rubs, no gallops Respiratory: CTA bilaterally, no  wheezing, no rhonchi Abdominal: Soft, NT, ND, bowel sounds + Extremities: no edema, no cyanosis    The results of significant diagnostics from this hospitalization (including imaging, microbiology, ancillary and laboratory) are listed below for reference.     Microbiology: Recent Results (from the past 240 hour(s))  SARS Coronavirus 2 by RT PCR (hospital order, performed in [facility] hospital lab) Nasopharyngeal Nasopharyngeal Swab     Status: None   Collection Time: 03/21/20 11:29 AM   Specimen: Nasopharyngeal Swab  Result Value Ref Range Status   SARS Coronavirus 2 NEGATIVE NEGATIVE Final    Comment: (NOTE) SARS-CoV-2 target nucleic acids are NOT DETECTED.  The SARS-CoV-2 RNA is generally detectable in upper and lower respiratory specimens during the acute phase of infection. The lowest concentration of SARS-CoV-2 viral copies this assay can detect is 250 copies / mL. A negative result does not preclude SARS-CoV-2 infection and should not be used as the sole basis for treatment or other patient management decisions.  A negative result may occur with improper specimen collection / handling, submission of specimen other than nasopharyngeal swab, presence of viral mutation(s) within the areas targeted by this assay, and inadequate number of viral copies (<250 copies / mL). A negative result must be combined with clinical observations, patient history, and epidemiological information.  Fact Sheet for Patients:   StrictlyIdeas.no  Fact Sheet for Healthcare Providers: BankingDealers.co.za  This test is not yet approved or  cleared by the [place] FDA and has been authorized for detection and/or diagnosis of SARS-CoV-2 by FDA under an Emergency Use Authorization (EUA).  This EUA will remain in effect (meaning this test can be used) for the duration of the COVID-19 declaration under Section 564(b)(1) of the Act, 21  U.S.C. section 360bbb-3(b)(1), unless the authorization is terminated or revoked sooner.  Performed at [facility], [address] [place], [place] [address]   Blood Culture (routine x 2)     Status: None (Preliminary result)   Collection Time: 03/21/20 11:30 AM   Specimen: BLOOD LEFT HAND  Result Value Ref Range Status   Specimen Description   Final    BLOOD LEFT HAND Performed at [facility], [address] [place], [place] [address]    Special Requests   Final    BOTTLES DRAWN AEROBIC AND ANAEROBIC Blood Culture results may not be optimal due to an inadequate volume of blood received in culture bottles Performed at [facility], [address] [place], [place] [address]  Culture   Final    NO GROWTH 2 DAYS Performed at [facility], [place] [address] [place], [place] [address]    Report Status PENDING  Incomplete  Blood Culture (routine x 2)     Status: None (Preliminary result)   Collection Time: 03/21/20 11:30 AM   Specimen: BLOOD  Result Value Ref Range Status   Specimen Description   Final    BLOOD LEFT ANTECUBITAL Performed at [facility], [place] [address], [place], [place] [address]    Special Requests   Final    BOTTLES DRAWN AEROBIC AND ANAEROBIC Blood Culture adequate volume Performed at [facility], [address] [place], [place] [address]    Culture   Final    NO GROWTH 2 DAYS Performed at [facility], [place] [address] [place], [place] [address]    Report Status PENDING  Incomplete     Labs: BNP (last 3 results) Recent Labs    03/21/20 0943  BNP 0,350.0*   Basic Metabolic Panel: Recent Labs  Lab 03/21/20 0943 03/22/20 0052 03/22/20 1813 03/23/20 0648  NA 136  --  133* 136  K 4.5  --  4.3 3.5  CL 104  --  101 103  CO2 23  --  18* 22  GLUCOSE 148*  --  141* 97  BUN 14  --  25* 30*  CREATININE 0.82  --  1.24 1.20  CALCIUM 9.5  --  8.2* 8.3*  MG  --  2.0  --  2.1   Liver  Function Tests: Recent Labs  Lab 03/21/20 0943  AST 15  ALT 10  ALKPHOS 59  BILITOT 0.5  PROT 7.1  ALBUMIN 3.7   No results for input(s): LIPASE, AMYLASE in the last 168 hours. No results for input(s): AMMONIA in the last 168 hours. CBC: Recent Labs  Lab 03/21/20 0943 03/21/20 0943 03/22/20 0052 03/22/20 0252 03/22/20 0958 03/23/20 0648 03/23/20 1313  WBC 12.1*  --  10.1 10.2 11.0* 8.7  --   NEUTROABS 7.5  --   --   --   --   --   --   HGB 9.6*   < > 10.8* 10.6* 11.0* 9.4* 9.9*  HCT 31.0*   < > 34.1* 33.4* 34.9* 29.7* 31.6*  MCV 76.4*  --  74.6* 75.6* 75.4* 75.4*  --   PLT 349  --  403* 383 393 364  --    < > = values in this interval not displayed.   Cardiac Enzymes: No results for input(s): CKTOTAL, CKMB, CKMBINDEX, TROPONINI in the last 168 hours. BNP: Invalid input(s): POCBNP CBG: No results for input(s): GLUCAP in the last 168 hours. D-Dimer No results for input(s): DDIMER in the last 72 hours. Hgb A1c No results for input(s): HGBA1C in the last 72 hours. Lipid Profile No results for input(s): CHOL, HDL, LDLCALC, TRIG, CHOLHDL, LDLDIRECT in the last 72 hours. Thyroid function studies Recent Labs    03/22/20 0050  TSH 0.604   Anemia work up No results for input(s): VITAMINB12, FOLATE, FERRITIN, TIBC, IRON, RETICCTPCT in the last 72 hours. Urinalysis    Component Value Date/Time   COLORURINE YELLOW 03/21/2020 1303   APPEARANCEUR CLEAR 03/21/2020 1303   LABSPEC 1.010 03/21/2020 1303   PHURINE 5.5 03/21/2020 1303   GLUCOSEU NEGATIVE 03/21/2020 1303   HGBUR NEGATIVE 03/21/2020 1303   BILIRUBINUR NEGATIVE 03/21/2020 1303   KETONESUR NEGATIVE 03/21/2020 1303   PROTEINUR NEGATIVE 03/21/2020  [place] 03/21/2020 1303   LEUKOCYTESUR NEGATIVE 03/21/2020 1303   Sepsis Labs Invalid input(s): PROCALCITONIN,  WBC,  LACTICIDVEN Microbiology Recent Results (from the past 240 hour(s))  SARS Coronavirus 2 by RT PCR (hospital order, performed in  [facility] hospital lab) Nasopharyngeal Nasopharyngeal Swab     Status: None   Collection Time: 03/21/20 11:29 AM   Specimen: Nasopharyngeal Swab  Result Value Ref Range Status   SARS Coronavirus 2 NEGATIVE NEGATIVE Final    Comment: (NOTE) SARS-CoV-2 target nucleic acids are NOT DETECTED.  The SARS-CoV-2 RNA is generally detectable in upper and lower respiratory specimens during the acute phase of infection. The lowest concentration of SARS-CoV-2 viral copies this assay can detect is 250 copies / mL. A negative result does not preclude SARS-CoV-2 infection and should not be used as the sole basis for treatment or other patient management decisions.  A negative result may occur with improper specimen collection / handling, submission of specimen other than nasopharyngeal swab, presence of viral mutation(s) within the areas targeted by this assay, and inadequate number of viral copies (<250 copies / mL). A negative result must be combined with clinical observations, patient history, and epidemiological information.  Fact Sheet for Patients:   StrictlyIdeas.no  Fact Sheet for Healthcare Providers: BankingDealers.co.za  This test is not yet approved or  cleared by the [place] FDA and has been authorized for detection and/or diagnosis of SARS-CoV-2 by FDA under an Emergency Use Authorization (EUA).  This EUA will remain in effect (meaning this test can be used) for the duration of the COVID-19 declaration under Section 564(b)(1) of the Act, 21 U.S.C. section 360bbb-3(b)(1), unless the authorization is terminated or revoked sooner.  Performed at [facility], [address] [place], [place] [address]   Blood Culture (routine x 2)     Status: None (Preliminary result)   Collection Time: 03/21/20 11:30 AM   Specimen: BLOOD LEFT HAND  Result Value Ref Range Status   Specimen Description   Final    BLOOD LEFT  HAND Performed at [facility], [address] [place], [place] [address]    Special Requests   Final    BOTTLES DRAWN AEROBIC AND ANAEROBIC Blood Culture results may not be optimal due to an inadequate volume of blood received in culture bottles Performed at [facility], [address] [place], [place] [address]    Culture   Final    NO GROWTH 2 DAYS Performed at [facility], [place] [address], [place], [place] [address]    Report Status PENDING  Incomplete  Blood Culture (routine x 2)     Status: None (Preliminary result)   Collection Time: 03/21/20 11:30 AM   Specimen: BLOOD  Result Value Ref Range Status   Specimen Description   Final    BLOOD LEFT ANTECUBITAL Performed at [facility], [place] [address], [place], [place] [address]    Special Requests   Final    BOTTLES DRAWN AEROBIC AND ANAEROBIC Blood Culture adequate volume Performed at [facility], [address] [place], [place] [address]    Culture   Final    NO GROWTH 2 DAYS Performed at [facility], [place] [address], [place], [place] [address]    Report Status PENDING  Incomplete     Time coordinating discharge: 35 minutes  SIGNED:   Rodena Goldmann, DO [facility] 03/23/2020,  3:02 PM  If 7PM-7AM, please contact night-coverage [email or web address]

## 2020-03-23 NOTE — Progress Notes (Signed)
ANTICOAGULATION CONSULT NOTE - Follow-Up Consult  Pharmacy Consult for heparin Indication: atrial fibrillation  No Known Allergies  Patient Measurements: Weight: 70.2 kg (154 lb 12.8 oz)  Vital Signs: Temp: 97.9 F (36.6 C) (07/23 0901) Temp Source: Oral (07/23 0901) BP: 132/75 (07/23 0901) Pulse Rate: 96 (07/23 0803)  Labs: Recent Labs    03/21/20 2334 03/21/20 3568 03/21/20 1129 03/22/20 0052 03/22/20 0052 03/22/20 0252 03/22/20 0252 03/22/20 0958 03/22/20 1813 03/23/20 0648  HGB 9.6*   < >  --  10.8*   < > 10.6*   < > 11.0*  --  9.4*  HCT 31.0*   < >  --  34.1*   < > 33.4*  --  34.9*  --  29.7*  PLT 349   < >  --  403*   < > 383  --  393  --  364  APTT  --   --   --   --   --   --   --  78* 99* 106*  HEPARINUNFRC  --   --   --   --   --   --   --  0.87*  --  0.63  CREATININE 0.82  --   --   --   --   --   --   --  1.24 1.20  TROPONINIHS 28*   < > 29* 39*  --  35*  --   --   --   --    < > = values in this interval not displayed.   Infusions:  . heparin 1,000 Units/hr (03/22/20 2328)   Assessment: 86yo male admitted for acute on chronic respiratory failure, to transition from Eliquis to UFH for Afib.  Of note FOB+ but stool brown, Hgb stable.  Currently on IV heparin at 1000 units/hr. APTT and HL seem to be correlating. Will manage off heparin levels alone moving forward. No s/s of bleeding noted per RN. H/H low, Plt wnl.    Goal of Therapy:  Heparin level 0.3-0.7 units/ml Monitor platelets by anticoagulation protocol: Yes   Plan:  - Continue Heparin at 1000 units/hr - Will continue to monitor for any signs/symptoms of bleeding and will follow up with  -Daily HL  Albertina Parr, PharmD., BCPS, BCCCP Clinical Pharmacist Clinical phone for 03/23/20 until 3:30pm: [phone] If after 3:30pm, please refer to [facility] for unit-specific pharmacist

## 2020-03-23 NOTE — Progress Notes (Signed)
SATURATION QUALIFICATIONS: (This note is used to comply with regulatory documentation for home oxygen)  Patient Saturations on Room Air at Rest = 99%  Patient Saturations on Room Air while Ambulating = 100%  Patient Saturations on 0 Liters of oxygen while Ambulating = 100%  Please briefly explain why patient needs home oxygen: Patient does not appear to need home oxygen.

## 2020-03-26 LAB — CULTURE, BLOOD (ROUTINE X 2)
Culture: NO GROWTH
Culture: NO GROWTH
Special Requests: ADEQUATE

## 2020-09-01 DEATH — deceased

## 2022-03-12 IMAGING — CR DG CHEST 2V
2 series · 2 of 2 positions shown · non-contrast
Comparison: None.

CLINICAL DATA: Shortness of breath

EXAM:
CHEST - 2 VIEW

[w chest pa]
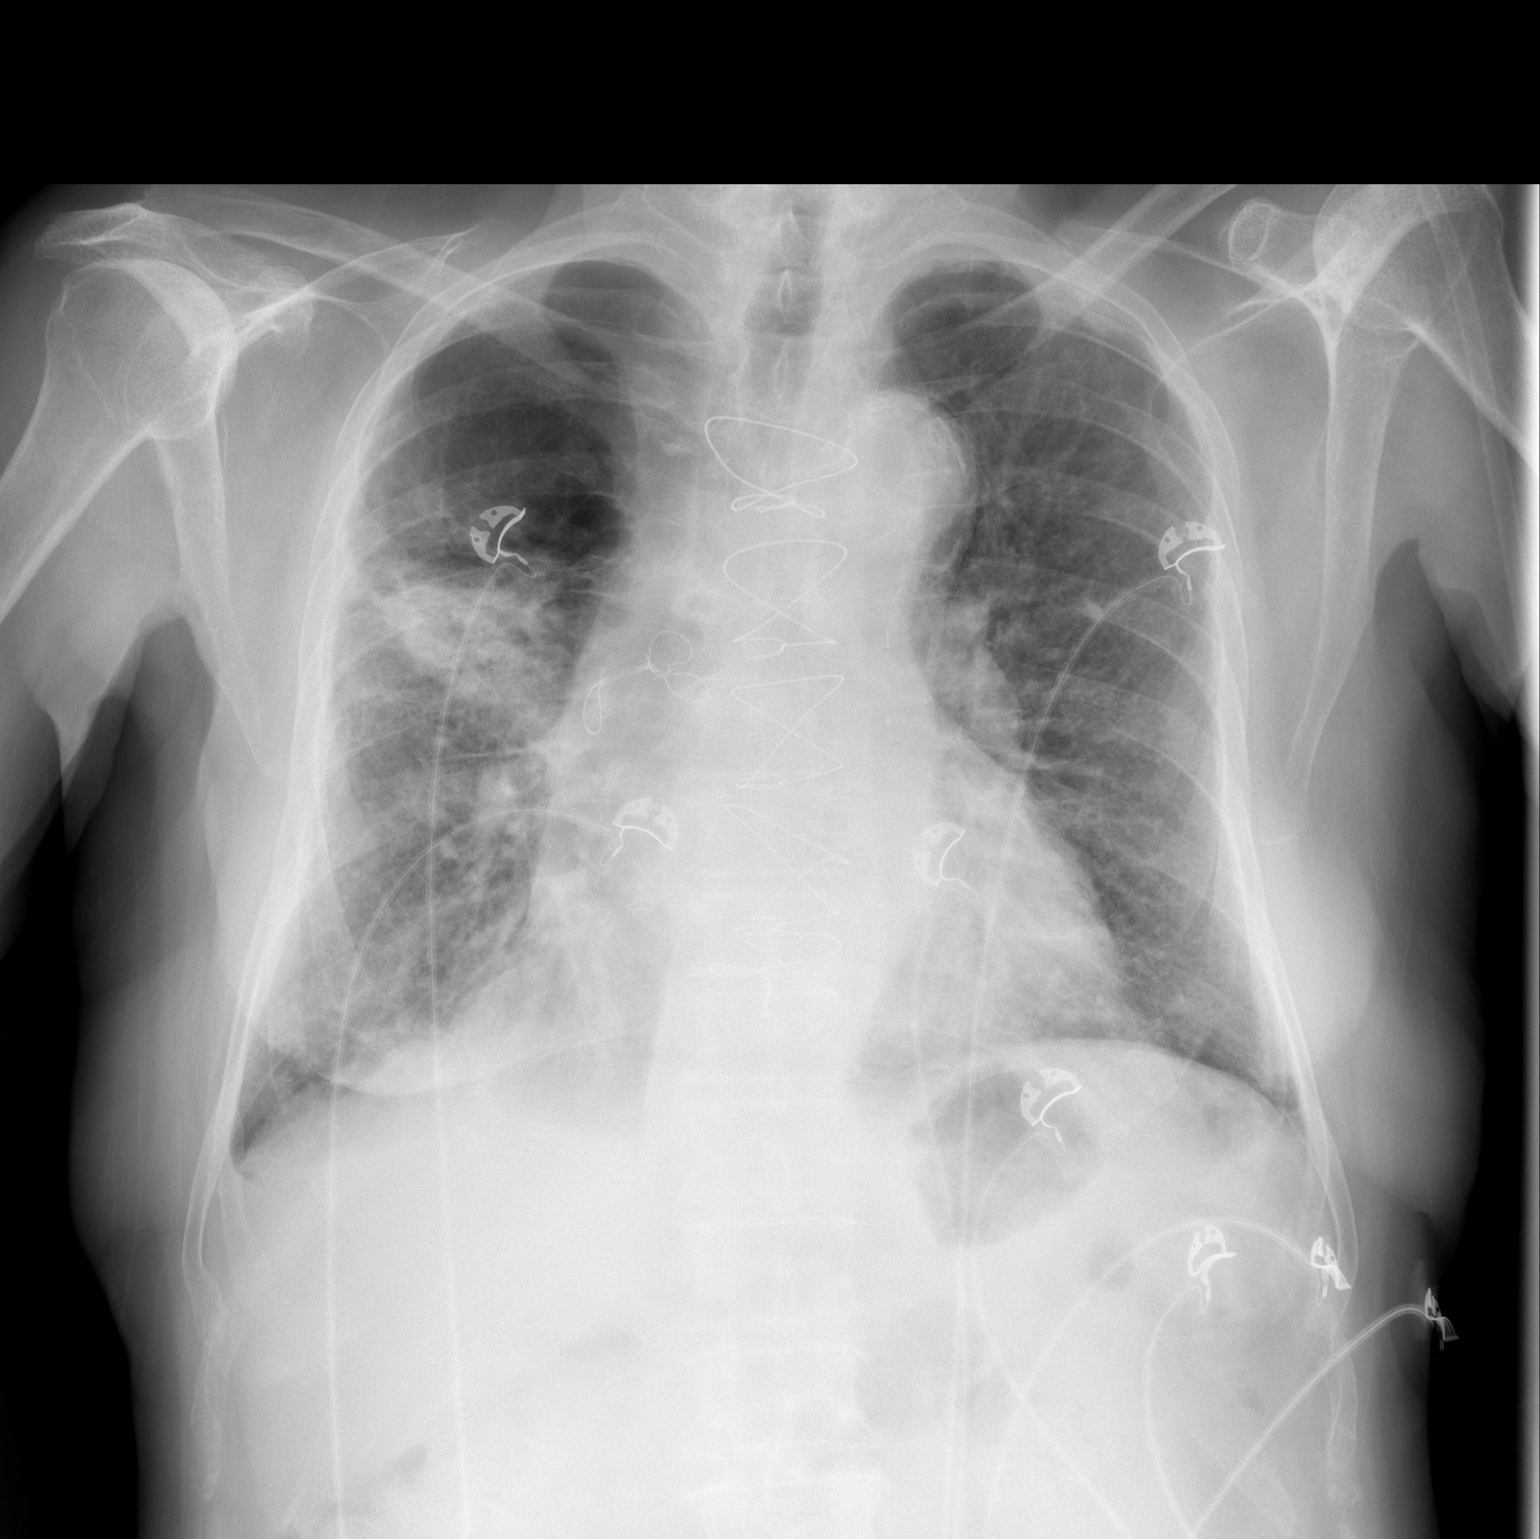

[w chest lat]
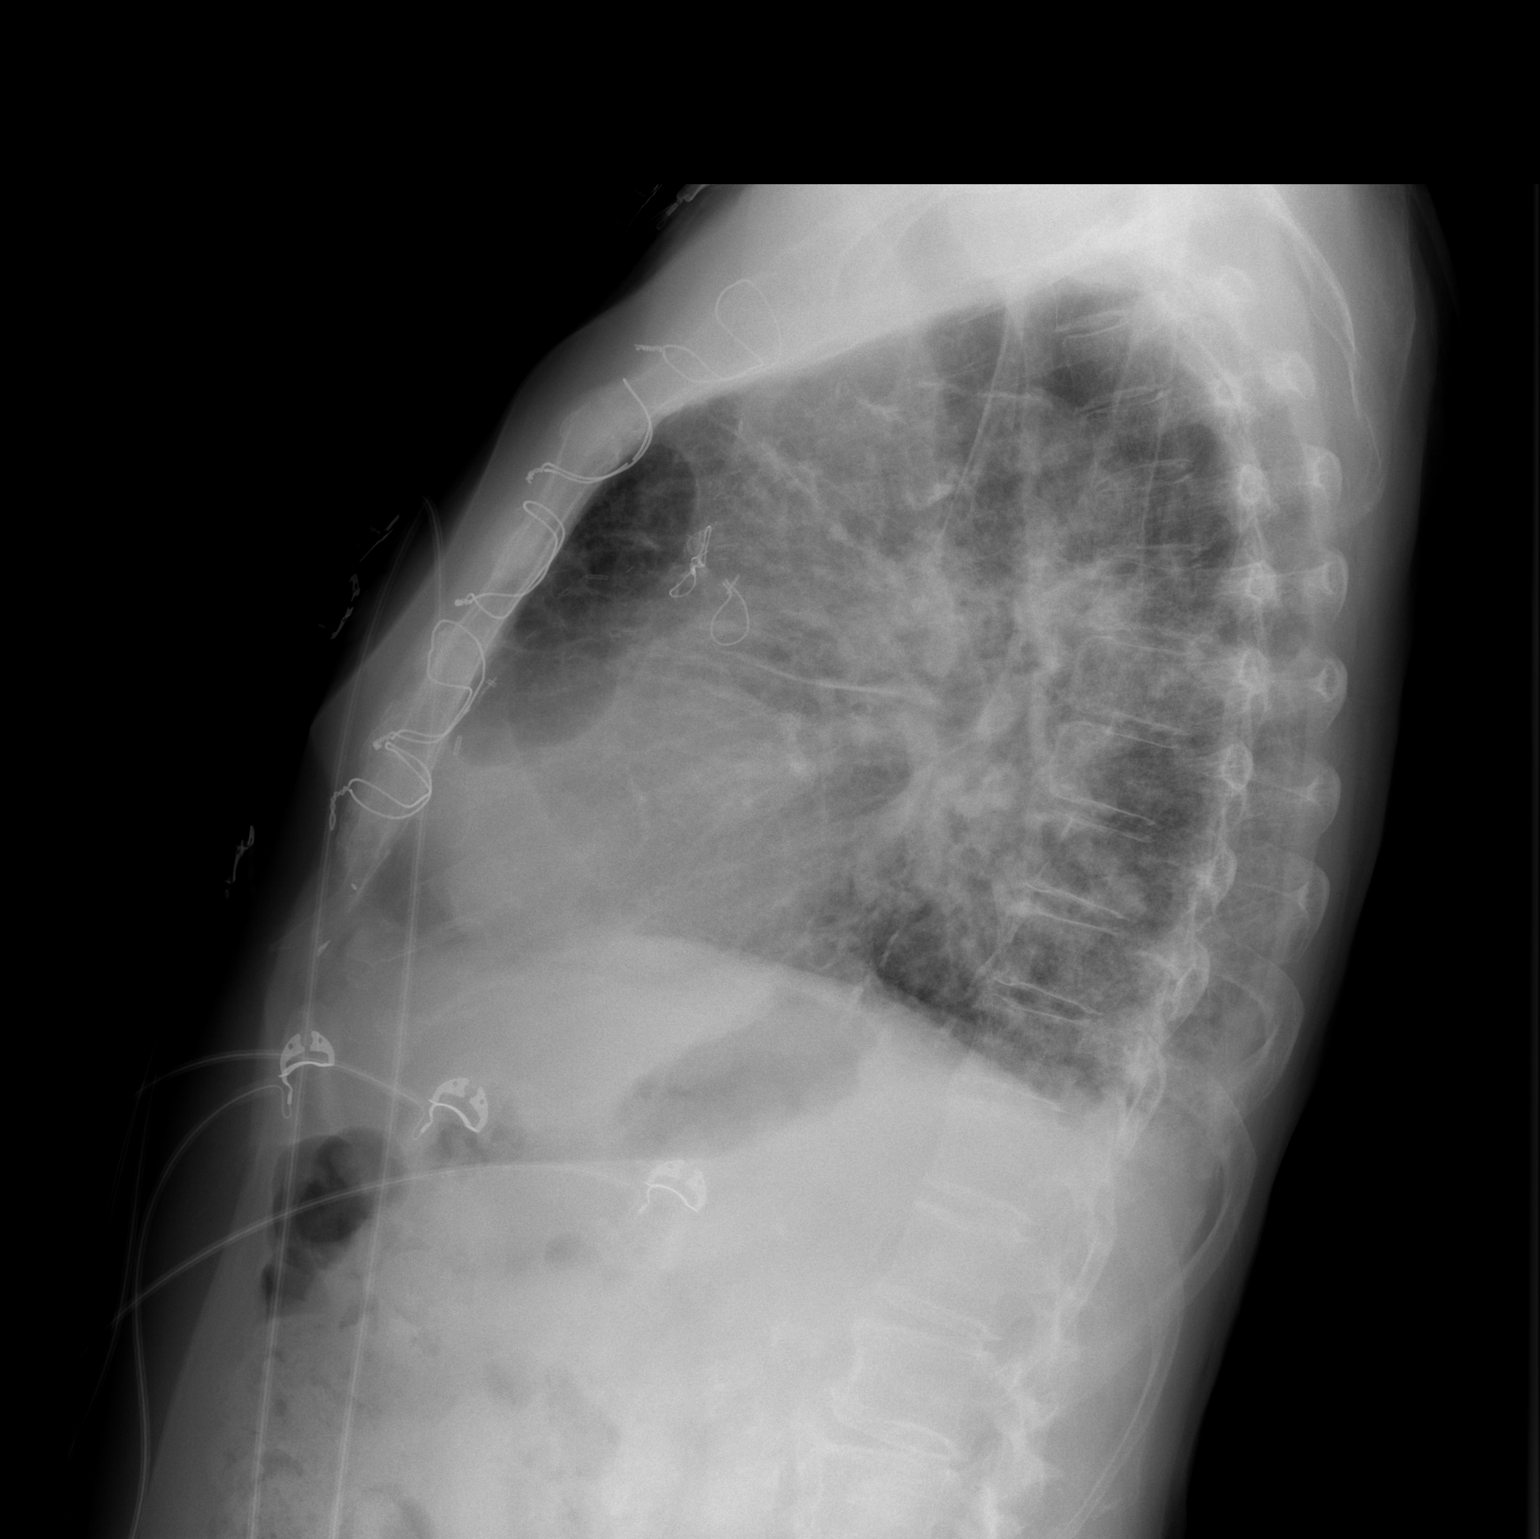

[2 of 2 positions shown; findings below may reference images not displayed]

FINDINGS: Right mid lung consolidation. Interstitial prominence. Lucency in
the upper lungs may reflect bulla. No pleural effusion. No
pneumothorax. Normal heart size. No acute osseous abnormality.
IMPRESSION: Right mid lung consolidation suspicious for pneumonia. Follow-up is
recommended to ensure resolution. Age-indeterminate interstitial
prominence which may be chronic, reflect edema, or changes
associated with pneumonia.
